# Patient Record
Sex: Male | Born: 1960 | Race: White | Hispanic: No | Marital: Single | Smoking: Former smoker
Health system: Southern US, Community
[De-identification: ages and names within clinical notes are randomized; demographics above are authoritative.]

## PROBLEM LIST (undated history)

## (undated) DIAGNOSIS — M86659 Other chronic osteomyelitis, unspecified thigh: Secondary | ICD-10-CM

## (undated) DIAGNOSIS — M726 Necrotizing fasciitis: Secondary | ICD-10-CM

## (undated) DIAGNOSIS — E1122 Type 2 diabetes mellitus with diabetic chronic kidney disease: Secondary | ICD-10-CM

## (undated) DIAGNOSIS — N186 End stage renal disease: Secondary | ICD-10-CM

## (undated) DIAGNOSIS — I10 Essential (primary) hypertension: Secondary | ICD-10-CM

## (undated) HISTORY — PX: TRANSMETATARSAL AMPUTATION: SHX6197

## (undated) HISTORY — PX: AV FISTULA PLACEMENT: SHX1204

---

## 2011-07-31 DIAGNOSIS — N186 End stage renal disease: Secondary | ICD-10-CM

## 2011-07-31 DIAGNOSIS — I10 Essential (primary) hypertension: Secondary | ICD-10-CM | POA: Diagnosis present

## 2015-10-14 DIAGNOSIS — D638 Anemia in other chronic diseases classified elsewhere: Secondary | ICD-10-CM | POA: Diagnosis present

## 2018-03-25 DIAGNOSIS — E1122 Type 2 diabetes mellitus with diabetic chronic kidney disease: Secondary | ICD-10-CM

## 2021-07-15 DIAGNOSIS — I739 Peripheral vascular disease, unspecified: Secondary | ICD-10-CM | POA: Diagnosis present

## 2021-10-08 DIAGNOSIS — M869 Osteomyelitis, unspecified: Secondary | ICD-10-CM | POA: Diagnosis present

## 2021-11-12 ENCOUNTER — Inpatient Hospital Stay (HOSPITAL_COMMUNITY): Payer: Medicaid Other

## 2021-11-12 ENCOUNTER — Encounter (HOSPITAL_COMMUNITY): Payer: Self-pay | Admitting: Internal Medicine

## 2021-11-12 ENCOUNTER — Inpatient Hospital Stay (HOSPITAL_COMMUNITY)
Admission: AD | Admit: 2021-11-12 | Discharge: 2021-12-14 | DRG: 871 | Disposition: E | Payer: Medicaid Other | Source: Other Acute Inpatient Hospital | Attending: Student | Admitting: Student

## 2021-11-12 DIAGNOSIS — Z66 Do not resuscitate: Secondary | ICD-10-CM | POA: Diagnosis not present

## 2021-11-12 DIAGNOSIS — R6521 Severe sepsis with septic shock: Secondary | ICD-10-CM | POA: Diagnosis not present

## 2021-11-12 DIAGNOSIS — I953 Hypotension of hemodialysis: Secondary | ICD-10-CM | POA: Diagnosis not present

## 2021-11-12 DIAGNOSIS — T361X5A Adverse effect of cephalosporins and other beta-lactam antibiotics, initial encounter: Secondary | ICD-10-CM | POA: Diagnosis present

## 2021-11-12 DIAGNOSIS — E1122 Type 2 diabetes mellitus with diabetic chronic kidney disease: Secondary | ICD-10-CM | POA: Diagnosis present

## 2021-11-12 DIAGNOSIS — Z87891 Personal history of nicotine dependence: Secondary | ICD-10-CM

## 2021-11-12 DIAGNOSIS — M869 Osteomyelitis, unspecified: Secondary | ICD-10-CM | POA: Diagnosis present

## 2021-11-12 DIAGNOSIS — G9341 Metabolic encephalopathy: Secondary | ICD-10-CM | POA: Diagnosis present

## 2021-11-12 DIAGNOSIS — I468 Cardiac arrest due to other underlying condition: Secondary | ICD-10-CM | POA: Diagnosis not present

## 2021-11-12 DIAGNOSIS — Z992 Dependence on renal dialysis: Secondary | ICD-10-CM

## 2021-11-12 DIAGNOSIS — A419 Sepsis, unspecified organism: Principal | ICD-10-CM | POA: Clinically undetermined

## 2021-11-12 DIAGNOSIS — Z833 Family history of diabetes mellitus: Secondary | ICD-10-CM

## 2021-11-12 DIAGNOSIS — N2581 Secondary hyperparathyroidism of renal origin: Secondary | ICD-10-CM | POA: Diagnosis present

## 2021-11-12 DIAGNOSIS — E1169 Type 2 diabetes mellitus with other specified complication: Secondary | ICD-10-CM | POA: Diagnosis present

## 2021-11-12 DIAGNOSIS — I120 Hypertensive chronic kidney disease with stage 5 chronic kidney disease or end stage renal disease: Secondary | ICD-10-CM | POA: Diagnosis present

## 2021-11-12 DIAGNOSIS — J9601 Acute respiratory failure with hypoxia: Secondary | ICD-10-CM | POA: Diagnosis not present

## 2021-11-12 DIAGNOSIS — J690 Pneumonitis due to inhalation of food and vomit: Secondary | ICD-10-CM | POA: Diagnosis not present

## 2021-11-12 DIAGNOSIS — I10 Essential (primary) hypertension: Secondary | ICD-10-CM | POA: Diagnosis present

## 2021-11-12 DIAGNOSIS — E1151 Type 2 diabetes mellitus with diabetic peripheral angiopathy without gangrene: Secondary | ICD-10-CM | POA: Diagnosis present

## 2021-11-12 DIAGNOSIS — I469 Cardiac arrest, cause unspecified: Secondary | ICD-10-CM | POA: Diagnosis not present

## 2021-11-12 DIAGNOSIS — J9600 Acute respiratory failure, unspecified whether with hypoxia or hypercapnia: Secondary | ICD-10-CM | POA: Diagnosis not present

## 2021-11-12 DIAGNOSIS — I674 Hypertensive encephalopathy: Secondary | ICD-10-CM | POA: Diagnosis not present

## 2021-11-12 DIAGNOSIS — Z79899 Other long term (current) drug therapy: Secondary | ICD-10-CM

## 2021-11-12 DIAGNOSIS — N186 End stage renal disease: Secondary | ICD-10-CM | POA: Diagnosis present

## 2021-11-12 DIAGNOSIS — G9340 Encephalopathy, unspecified: Secondary | ICD-10-CM | POA: Diagnosis present

## 2021-11-12 DIAGNOSIS — G928 Other toxic encephalopathy: Secondary | ICD-10-CM | POA: Diagnosis present

## 2021-11-12 DIAGNOSIS — J9602 Acute respiratory failure with hypercapnia: Secondary | ICD-10-CM | POA: Diagnosis not present

## 2021-11-12 DIAGNOSIS — Z823 Family history of stroke: Secondary | ICD-10-CM

## 2021-11-12 DIAGNOSIS — D638 Anemia in other chronic diseases classified elsewhere: Secondary | ICD-10-CM

## 2021-11-12 DIAGNOSIS — Z7982 Long term (current) use of aspirin: Secondary | ICD-10-CM

## 2021-11-12 DIAGNOSIS — M86671 Other chronic osteomyelitis, right ankle and foot: Secondary | ICD-10-CM | POA: Diagnosis present

## 2021-11-12 DIAGNOSIS — I739 Peripheral vascular disease, unspecified: Secondary | ICD-10-CM | POA: Diagnosis present

## 2021-11-12 DIAGNOSIS — Z89431 Acquired absence of right foot: Secondary | ICD-10-CM | POA: Diagnosis not present

## 2021-11-12 DIAGNOSIS — D631 Anemia in chronic kidney disease: Secondary | ICD-10-CM | POA: Diagnosis present

## 2021-11-12 DIAGNOSIS — Z515 Encounter for palliative care: Secondary | ICD-10-CM

## 2021-11-12 DIAGNOSIS — Z7902 Long term (current) use of antithrombotics/antiplatelets: Secondary | ICD-10-CM

## 2021-11-12 DIAGNOSIS — Z8249 Family history of ischemic heart disease and other diseases of the circulatory system: Secondary | ICD-10-CM

## 2021-11-12 DIAGNOSIS — Z794 Long term (current) use of insulin: Secondary | ICD-10-CM

## 2021-11-12 DIAGNOSIS — Z7189 Other specified counseling: Secondary | ICD-10-CM | POA: Diagnosis not present

## 2021-11-12 HISTORY — DX: Necrotizing fasciitis: M72.6

## 2021-11-12 HISTORY — DX: End stage renal disease: N18.6

## 2021-11-12 HISTORY — DX: Type 2 diabetes mellitus with diabetic chronic kidney disease: E11.22

## 2021-11-12 HISTORY — DX: Other chronic osteomyelitis, unspecified thigh: M86.659

## 2021-11-12 HISTORY — DX: Essential (primary) hypertension: I10

## 2021-11-12 LAB — GLUCOSE, CAPILLARY
Glucose-Capillary: 198 mg/dL — ABNORMAL HIGH (ref 70–99)
Glucose-Capillary: 160 mg/dL — ABNORMAL HIGH (ref 70–99)
Glucose-Capillary: 138 mg/dL — ABNORMAL HIGH (ref 70–99)
Glucose-Capillary: 133 mg/dL — ABNORMAL HIGH (ref 70–99)
Glucose-Capillary: 131 mg/dL — ABNORMAL HIGH (ref 70–99)

## 2021-11-12 LAB — COMPREHENSIVE METABOLIC PANEL
ALT: 13 U/L (ref 0–44)
AST: 16 U/L (ref 15–41)
Albumin: 3.3 g/dL — ABNORMAL LOW (ref 3.5–5.0)
Alkaline Phosphatase: 33 U/L — ABNORMAL LOW (ref 38–126)
Anion gap: 15 (ref 5–15)
BUN: 44 mg/dL — ABNORMAL HIGH (ref 8–23)
CO2: 24 mmol/L (ref 22–32)
Calcium: 9.8 mg/dL (ref 8.9–10.3)
Chloride: 102 mmol/L (ref 98–111)
Creatinine, Ser: 8.4 mg/dL — ABNORMAL HIGH (ref 0.61–1.24)
GFR, Estimated: 7 mL/min — ABNORMAL LOW (ref >60)
Glucose, Bld: 212 mg/dL — ABNORMAL HIGH (ref 70–99)
Potassium: 4.4 mmol/L (ref 3.5–5.1)
Sodium: 141 mmol/L (ref 135–145)
Total Bilirubin: 0.7 mg/dL (ref 0.3–1.2)
Total Protein: 7.7 g/dL (ref 6.5–8.1)

## 2021-11-12 LAB — CBC WITH DIFFERENTIAL/PLATELET
Abs Immature Granulocytes: 0.07 10*3/uL (ref 0.00–0.07)
Basophils Absolute: 0.1 10*3/uL (ref 0.0–0.1)
Basophils Relative: 1 %
Eosinophils Absolute: 0.2 10*3/uL (ref 0.0–0.5)
Eosinophils Relative: 2 %
HCT: 33.4 % — ABNORMAL LOW (ref 39.0–52.0)
Hemoglobin: 11.5 g/dL — ABNORMAL LOW (ref 13.0–17.0)
Immature Granulocytes: 1 %
Lymphocytes Relative: 15 %
Lymphs Abs: 1.9 10*3/uL (ref 0.7–4.0)
MCH: 31.5 pg (ref 26.0–34.0)
MCHC: 34.4 g/dL (ref 30.0–36.0)
MCV: 91.5 fL (ref 80.0–100.0)
Monocytes Absolute: 1.3 10*3/uL — ABNORMAL HIGH (ref 0.1–1.0)
Monocytes Relative: 10 %
Neutro Abs: 9.2 10*3/uL — ABNORMAL HIGH (ref 1.7–7.7)
Neutrophils Relative %: 71 %
Platelets: 205 10*3/uL (ref 150–400)
RBC: 3.65 MIL/uL — ABNORMAL LOW (ref 4.22–5.81)
RDW: 14.1 % (ref 11.5–15.5)
WBC: 12.8 10*3/uL — ABNORMAL HIGH (ref 4.0–10.5)
nRBC: 0 % (ref 0.0–0.2)

## 2021-11-12 LAB — HIV ANTIBODY (ROUTINE TESTING W REFLEX): HIV Screen 4th Generation wRfx: NONREACTIVE

## 2021-11-12 LAB — MAGNESIUM: Magnesium: 2.1 mg/dL (ref 1.7–2.4)

## 2021-11-12 LAB — HEMOGLOBIN A1C
Hgb A1c MFr Bld: 6.5 % — ABNORMAL HIGH (ref 4.8–5.6)
Mean Plasma Glucose: 139.85 mg/dL

## 2021-11-12 LAB — HEPATITIS B SURFACE ANTIGEN: Hepatitis B Surface Ag: NONREACTIVE

## 2021-11-12 LAB — HEPATITIS B SURFACE ANTIBODY,QUALITATIVE: Hep B S Ab: REACTIVE — AB

## 2021-11-12 IMAGING — MR MR HEAD W/O CM
9 of 10 series · 37 of 48 positions shown · non-contrast
Comparison: None Available.

CLINICAL DATA: Initial evaluation for mental status change, unknown
cause.

EXAM:
MRI HEAD WITHOUT CONTRAST
TECHNIQUE: Multiplanar, multiecho pulse sequences of the brain and surrounding
structures were obtained without intravenous contrast.

[Series 2: DWI · axial · 3.0mm · 0.94mm/px · z∈[-59,+86]mm · 8 of 100 slices shown (1 of 2)]
[im 1/100]
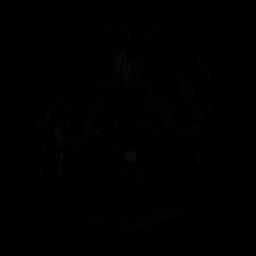
[im 12/100]
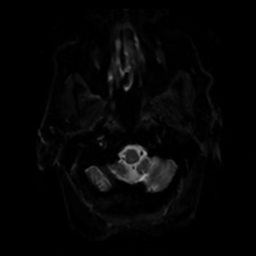
[im 34/100]
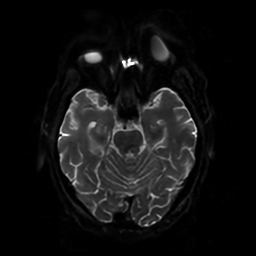
[im 45/100]
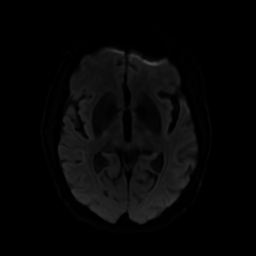
[im 56/100]
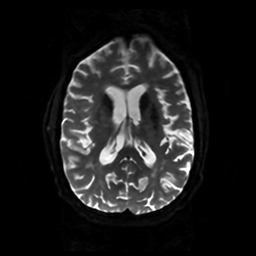
[im 67/100]
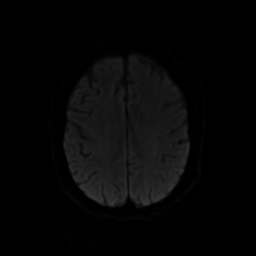
[im 89/100]
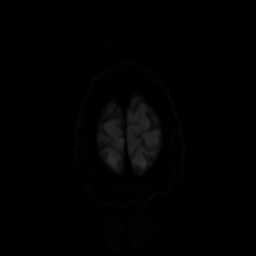
[im 100/100]
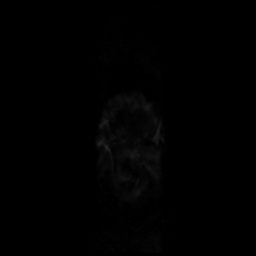

[Series 3: DWI · coronal · 4.0mm · 0.94mm/px · 8 of 74 slices shown (2 of 2)]
[im 1/74]
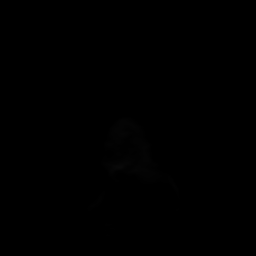
[im 11/74]
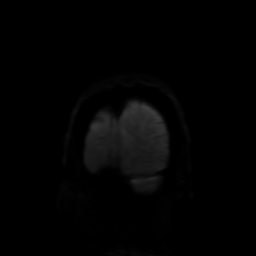
[im 21/74]
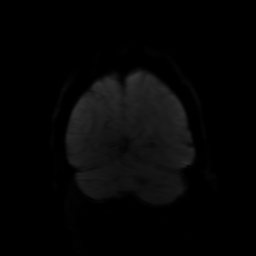
[im 32/74]
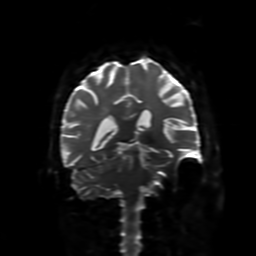
[im 42/74]
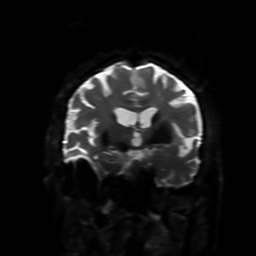
[im 53/74]
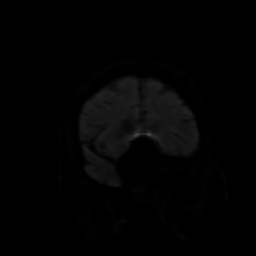
[im 63/74]
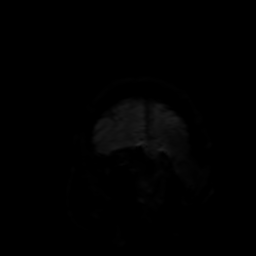
[im 74/74]
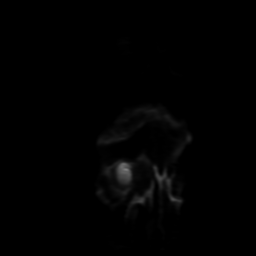

[Series 4: FLAIR · axial · 5.0mm · 0.47mm/px · z∈[-58,+84]mm · 2 of 25 slices shown (1 of 2)]
[im 1/25]
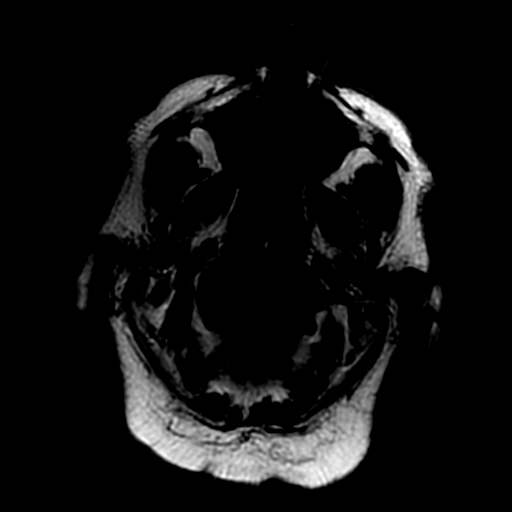
[im 25/25]
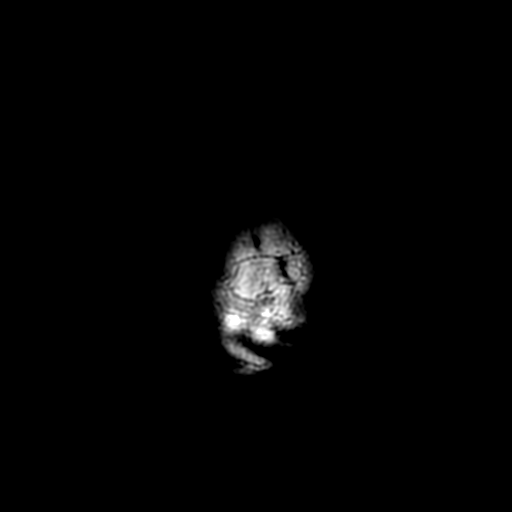

[Series 5: (person_name) · axial · 3.0mm · 0.47mm/px · z∈[-64,+27]mm · 5 of 100 slices shown]
[im 1/100]
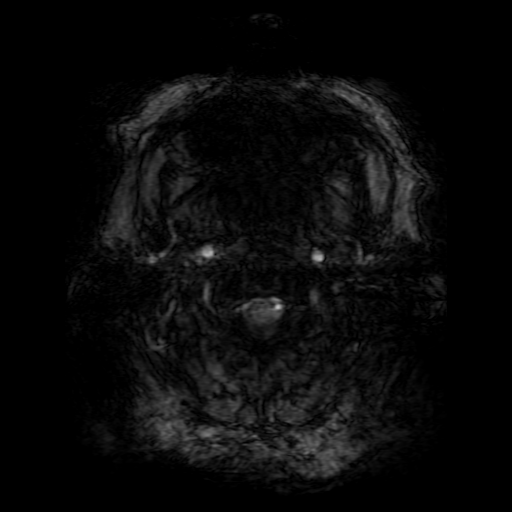
[im 13/100]
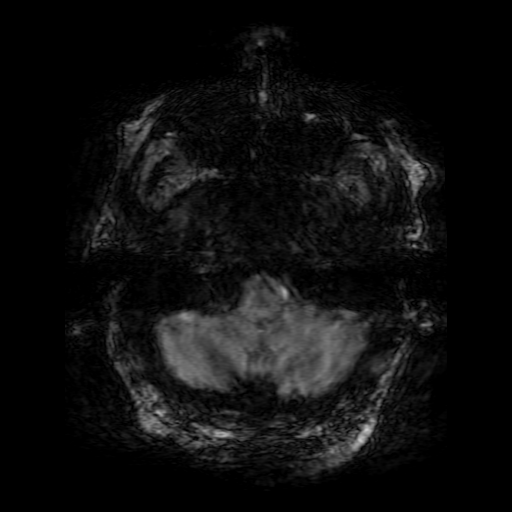
[im 25/100]
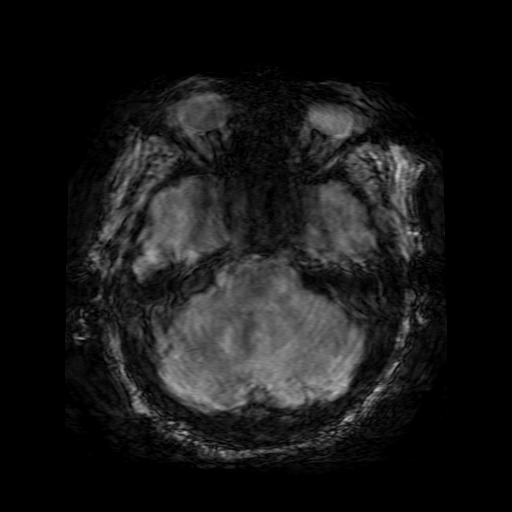
[im 38/100]
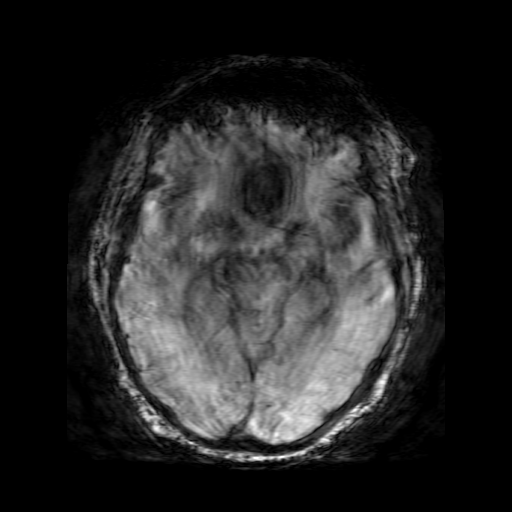
[im 62/100]
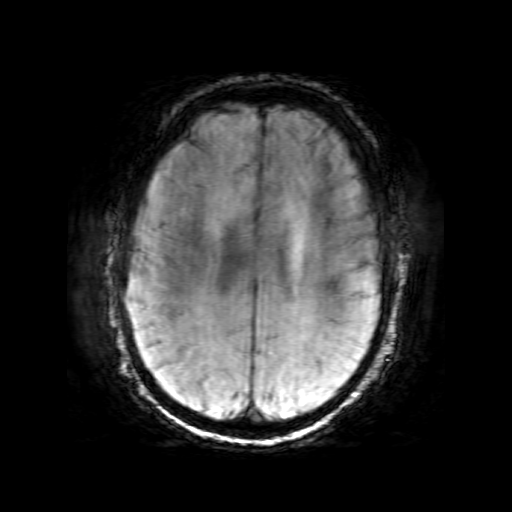

[Series 6: FLAIR · sagittal · 5.0mm · 0.47mm/px · 2 of 25 slices shown (2 of 2)]
[im 1/25]
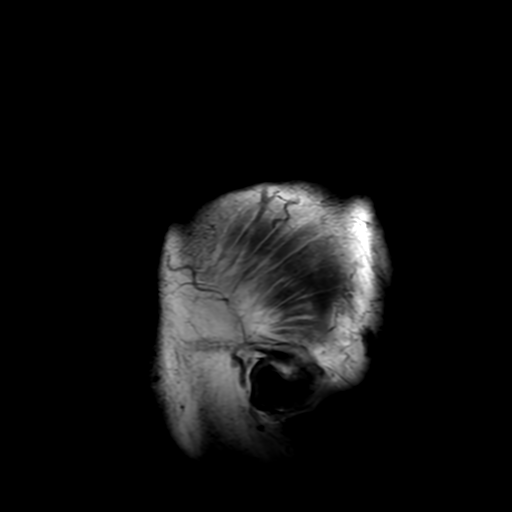
[im 25/25]
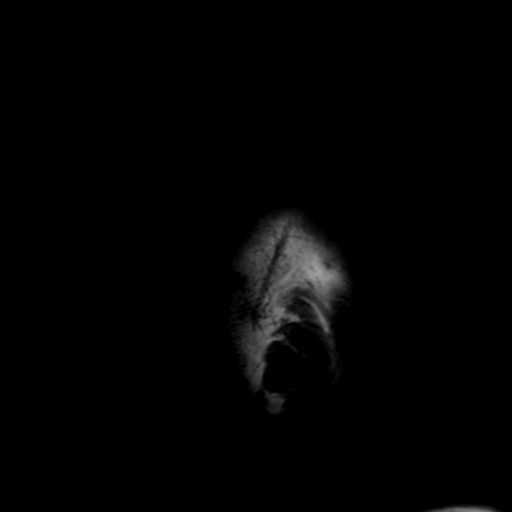

[Series 7: T2 · axial · 5.0mm · 0.47mm/px · z∈[-58,+84]mm · 2 of 25 slices shown (1 of 2)]
[im 1/25]
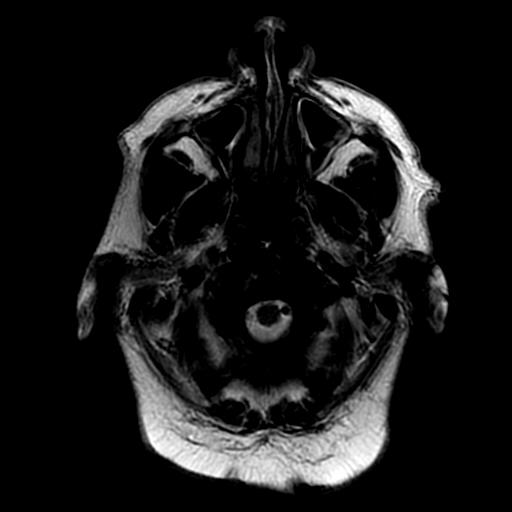
[im 25/25]
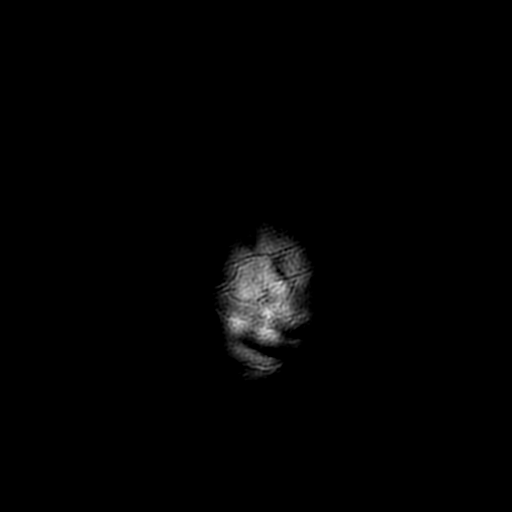

[Series 8: T2 · coronal · 5.0mm · 0.94mm/px · 3 of 31 slices shown (2 of 2)]
[im 1/31]
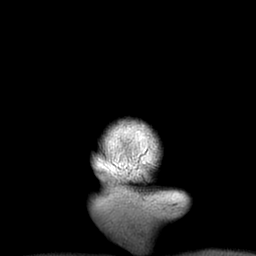
[im 16/31]
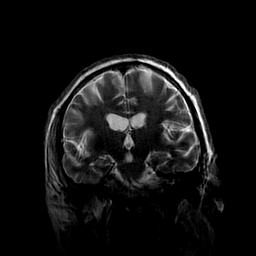
[im 31/31]
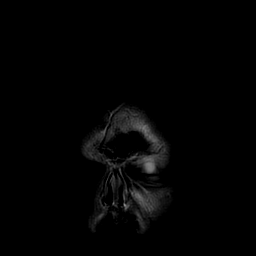

[Series 250: ADC · axial · 3.0mm · 0.94mm/px · z∈[-59,+86]mm · 4 of 45 slices shown (1 of 2)]
[im 1/45]
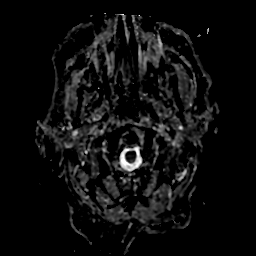
[im 15/45]
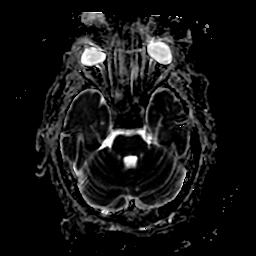
[im 30/45]
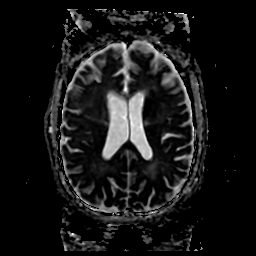
[im 45/45]
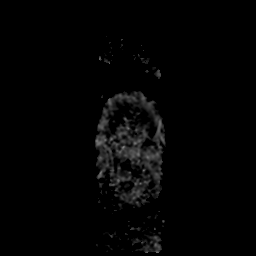

[Series 350: ADC · coronal · 4.0mm · 0.94mm/px · 3 of 37 slices shown (2 of 2)]
[im 1/37]
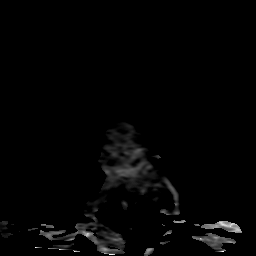
[im 19/37]
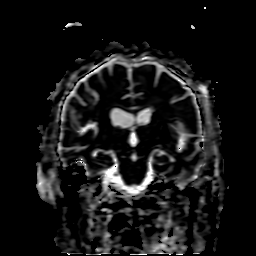
[im 37/37]
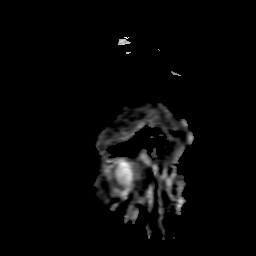

[37 of 48 positions shown; findings below may reference images not displayed]

FINDINGS: Brain: Examination moderately degraded by motion artifact.

Cerebral volume within normal limits. Mild chronic microvascular
ischemic disease noted involving the periventricular and deep white
matter both cerebral hemispheres. Small remote lacunar infarct noted
at the body of the corpus callosum.

No evidence for acute or subacute ischemia. Gray-white matter
differentiation maintained. No areas of chronic cortical infarction.
No visible acute or chronic intracranial blood products.

No mass lesion, mass effect or midline shift. No hydrocephalus or
extra-axial fluid collection. Pituitary gland suprasellar region
within normal limits. Midline structures intact and normally formed.

Vascular: Major intracranial vascular flow voids are grossly
maintained at the skull base.

Skull and upper cervical spine: Craniocervical junction within
normal limits. Bone marrow signal intensity normal. No scalp soft
tissue abnormality.

Sinuses/Orbits: Prior bilateral ocular lens replacement. Scattered
mucosal thickening noted throughout the paranasal sinuses. No
significant mastoid effusion.

Other: None.
IMPRESSION: 1. No acute intracranial abnormality.
2. Mild chronic microvascular ischemic disease for age.

## 2021-11-12 MED ORDER — INSULIN ASPART 100 UNIT/ML IJ SOLN: 0.0000 [IU] | Freq: Every day | INTRAMUSCULAR | Status: DC

## 2021-11-12 MED ORDER — HYDRALAZINE HCL 20 MG/ML IJ SOLN: 10.0000 mg | Freq: Four times a day (QID) | INTRAMUSCULAR | Status: DC | PRN

## 2021-11-12 MED ORDER — ACETAMINOPHEN 650 MG RE SUPP: 650.0000 mg | Freq: Four times a day (QID) | RECTAL | Status: DC | PRN

## 2021-11-12 MED ORDER — METRONIDAZOLE 500 MG PO TABS
500.0000 mg | ORAL_TABLET | Freq: Two times a day (BID) | ORAL | Status: AC
  Administered 2021-11-12: 500 mg via ORAL
  Filled 2021-11-12: qty 1

## 2021-11-12 MED ORDER — HALOPERIDOL LACTATE 5 MG/ML IJ SOLN
4.0000 mg | Freq: Four times a day (QID) | INTRAMUSCULAR | Status: DC | PRN
Start: 2021-11-12 — End: 2021-11-15

## 2021-11-12 MED ORDER — METRONIDAZOLE 500 MG/100ML IV SOLN
500.0000 mg | Freq: Two times a day (BID) | INTRAVENOUS | Status: DC
  Administered 2021-11-12: 500 mg via INTRAVENOUS
  Filled 2021-11-12: qty 100

## 2021-11-12 MED ORDER — HALOPERIDOL LACTATE 5 MG/ML IJ SOLN
2.0000 mg | Freq: Four times a day (QID) | INTRAMUSCULAR | Status: DC | PRN
Start: 2021-11-12 — End: 2021-11-12

## 2021-11-12 MED ORDER — DOXERCALCIFEROL 4 MCG/2ML IV SOLN
3.0000 ug | INTRAVENOUS | Status: AC
  Administered 2021-11-13: 3 ug via INTRAVENOUS
  Filled 2021-11-12: qty 2

## 2021-11-12 MED ORDER — LABETALOL HCL 5 MG/ML IV SOLN: 10.0000 mg | INTRAVENOUS | Status: DC | PRN

## 2021-11-12 MED ORDER — HEPARIN SODIUM (PORCINE) 5000 UNIT/ML IJ SOLN
5000.0000 [IU] | Freq: Three times a day (TID) | INTRAMUSCULAR | Status: DC
  Administered 2021-11-12 – 2021-11-15 (×8): 5000 [IU] via SUBCUTANEOUS
  Filled 2021-11-12 (×8): qty 1

## 2021-11-12 MED ORDER — DOXERCALCIFEROL 4 MCG/2ML IV SOLN: 3.0000 ug | INTRAVENOUS | Status: DC

## 2021-11-12 MED ORDER — VANCOMYCIN HCL IN DEXTROSE 1-5 GM/200ML-% IV SOLN
1000.0000 mg | INTRAVENOUS | Status: DC
Start: 2021-11-12 — End: 2021-11-12

## 2021-11-12 MED ORDER — HALOPERIDOL LACTATE 5 MG/ML IJ SOLN
4.0000 mg | Freq: Four times a day (QID) | INTRAMUSCULAR | Status: DC | PRN
Start: 2021-11-12 — End: 2021-11-15
  Administered 2021-11-13: 4 mg via INTRAVENOUS
  Filled 2021-11-12: qty 1

## 2021-11-12 MED ORDER — ONDANSETRON HCL 4 MG PO TABS: 4.0000 mg | ORAL_TABLET | Freq: Four times a day (QID) | ORAL | Status: DC | PRN

## 2021-11-12 MED ORDER — SODIUM CHLORIDE 0.9 % IV SOLN
2.0000 g | Freq: Once | INTRAVENOUS | Status: AC
  Administered 2021-11-12: 2 g via INTRAVENOUS
  Filled 2021-11-12 (×2): qty 12.5

## 2021-11-12 MED ORDER — CLONIDINE HCL 0.2 MG PO TABS: 0.2000 mg | ORAL_TABLET | ORAL | Status: DC | PRN

## 2021-11-12 MED ORDER — INSULIN ASPART 100 UNIT/ML IJ SOLN
0.0000 [IU] | Freq: Three times a day (TID) | INTRAMUSCULAR | Status: DC
  Administered 2021-11-12 – 2021-11-15 (×5): 1 [IU] via SUBCUTANEOUS
  Administered 2021-11-15: 2 [IU] via SUBCUTANEOUS

## 2021-11-12 MED ORDER — VANCOMYCIN HCL IN DEXTROSE 1-5 GM/200ML-% IV SOLN
1000.0000 mg | INTRAVENOUS | Status: AC
  Administered 2021-11-13: 1000 mg via INTRAVENOUS
  Filled 2021-11-12 (×2): qty 200

## 2021-11-12 MED ORDER — LORAZEPAM 2 MG/ML IJ SOLN: 1.0000 mg | Freq: Once | INTRAMUSCULAR | Status: DC | PRN

## 2021-11-12 MED ORDER — ONDANSETRON HCL 4 MG/2ML IJ SOLN
4.0000 mg | Freq: Four times a day (QID) | INTRAMUSCULAR | Status: DC | PRN
  Administered 2021-11-13 – 2021-11-14 (×2): 4 mg via INTRAVENOUS
  Filled 2021-11-12 (×2): qty 2

## 2021-11-12 MED ORDER — QUETIAPINE FUMARATE 25 MG PO TABS: 25.0000 mg | ORAL_TABLET | Freq: Every day | ORAL | Status: DC

## 2021-11-12 MED ORDER — AMLODIPINE BESYLATE 10 MG PO TABS
10.0000 mg | ORAL_TABLET | Freq: Every day | ORAL | Status: DC
  Administered 2021-11-12: 10 mg via ORAL
  Filled 2021-11-12: qty 1

## 2021-11-12 MED ORDER — HALOPERIDOL LACTATE 5 MG/ML IJ SOLN
2.0000 mg | Freq: Four times a day (QID) | INTRAMUSCULAR | Status: DC | PRN
  Administered 2021-11-12: 2 mg via INTRAMUSCULAR
  Filled 2021-11-12: qty 1

## 2021-11-12 MED ORDER — LORAZEPAM 2 MG/ML IJ SOLN
1.0000 mg | Freq: Once | INTRAMUSCULAR | Status: AC
  Administered 2021-11-12: 1 mg via INTRAVENOUS
  Filled 2021-11-12: qty 1

## 2021-11-12 MED ORDER — ACETAMINOPHEN 325 MG PO TABS: 650.0000 mg | ORAL_TABLET | Freq: Four times a day (QID) | ORAL | Status: DC | PRN

## 2021-11-12 MED ORDER — INSULIN GLARGINE-YFGN 100 UNIT/ML ~~LOC~~ SOLN
10.0000 [IU] | Freq: Every day | SUBCUTANEOUS | Status: DC
  Administered 2021-11-12 – 2021-11-14 (×4): 10 [IU] via SUBCUTANEOUS
  Filled 2021-11-12 (×7): qty 0.1

## 2021-11-12 MED ORDER — METOPROLOL TARTRATE 50 MG PO TABS
50.0000 mg | ORAL_TABLET | Freq: Two times a day (BID) | ORAL | Status: DC
  Administered 2021-11-12 – 2021-11-14 (×5): 50 mg via ORAL
  Filled 2021-11-12 (×5): qty 1

## 2021-11-12 MED ORDER — QUETIAPINE FUMARATE 50 MG PO TABS
50.0000 mg | ORAL_TABLET | Freq: Every day | ORAL | Status: DC
  Administered 2021-11-12: 50 mg via ORAL
  Filled 2021-11-12: qty 1

## 2021-11-12 MED ORDER — METOPROLOL TARTRATE 5 MG/5ML IV SOLN
5.0000 mg | INTRAVENOUS | Status: AC
  Administered 2021-11-12: 5 mg via INTRAVENOUS
  Filled 2021-11-12: qty 5

## 2021-11-12 MED ORDER — CHLORHEXIDINE GLUCONATE CLOTH 2 % EX PADS
6.0000 | MEDICATED_PAD | Freq: Every day | CUTANEOUS | Status: DC
Start: 2021-11-12 — End: 2021-11-16
  Administered 2021-11-12 – 2021-11-16 (×5): 6 via TOPICAL

## 2021-11-12 NOTE — Progress Notes (Signed)
Pt receives out-pt HD at [facility] on TTS on 2nd shift. Spoke to clinic staff who report that pt is not normally confused and is compliant with HD treatments. Pt rides transportation to get to/from HD appts. Clinic advised pt is currently hospitalized and that navigator will contact clinic once d/c date is known. Will assist as needed.   Melven Sartorius Renal Navigator [phone]

## 2021-11-12 NOTE — Progress Notes (Signed)
Informed by HD staff of change in patient's mental status, requiring haldol and restraints. Due for MRI. Will hold off on HD today and aim to do HD tomorrow. Had seen and examined him earlier and his volume status was acceptable, K WNL today. No emergent indications for HD today.  Gean Quint, MD [facility]

## 2021-11-12 NOTE — Assessment & Plan Note (Signed)
Chronic. 

## 2021-11-12 NOTE — Evaluation (Signed)
Physical Therapy Evaluation Patient Details Name: Henry Phillips MRN: [id number] DOB: Aug 26, 1960 Today's Date: 10/29/2021  History of Present Illness  Pt is a 61 y.o. male admitted 10/24/2021 with confusion, uncontrolled HTN. Head CT negative for acute abnormality. Workup for hypertensive urgency, acute metabolic encephalopathy with concern for PRES. EEG suggestive of moderate diffuse encephalopathy; no seizures seen. Brain MRI pending. 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   Clinical Impression  Pt presents with an overall decrease in functional mobility secondary to above. Per chart, pt resides with sister, has transportation for HD appts; today, pt disoriented and poor historian, reports he lives alone and indep without. Today, pt mod indep with bed mobility; deferred OOB mobility due to safety concerns reported by nursing. Pt demonstrates good strength; expect pt will progress well with mobility once cognition/AMS improves. Will follow acutely to address established goals.    Recommendations for follow up therapy are one component of a multi-disciplinary discharge planning process, led by the attending physician.  Recommendations may be updated based on patient status, additional functional criteria and insurance authorization.  Follow Up Recommendations No PT follow up    Assistance Recommended at Discharge Frequent or constant Supervision/Assistance  Patient can return home with the following  Assistance with cooking/housework;Direct supervision/assist for medications management;Direct supervision/assist for financial management;Assist for transportation    Equipment Recommendations None recommended by PT  Recommendations for Other Services       Functional Status Assessment Patient has had a recent decline in their functional status and demonstrates the ability to make significant improvements in function in a reasonable and predictable amount of  time.     Precautions / Restrictions Precautions Precautions: Fall;Other (comment) Precaution Comments: wrist restraints Restrictions Weight Bearing Restrictions: No      Mobility  Bed Mobility Overal bed mobility: Modified Independent             General bed mobility comments: supine<>sit mod indep with HOB minimally elevated. pt indep to bridge hips up in bed multiple times, even in wrist restraints, to reposition bedding    Transfers                   General transfer comment: deferred standing activity for safety due to pt's AMS    Ambulation/Gait                  Stairs            Wheelchair Mobility    Modified Rankin (Stroke Patients Only)       Balance Overall balance assessment: Needs assistance   Sitting balance-Leahy Scale: Good                                       Pertinent Vitals/Pain Pain Assessment Pain Assessment: No/denies pain    Home Living Family/patient expects to be discharged to:: Private residence Living Arrangements: Other relatives Available Help at Discharge: Family Type of Home: House Home Access: Ramped entrance       Home Layout: Two level Home Equipment: Wheelchair - manual Additional Comments: suspect not fully reliable information - pt reports he lives alone; per CM note, pt has lived with sister's [name]) family for past 14 yrs. per chart, HD clinic reports pt typically A&Ox4    Prior Function Prior Level of Function : Patient poor historian/Family not available;Independent/Modified Independent  Mobility Comments: pt reports indep without DME; unsure reliable info. per CM note, sister reports having w/c and ramp; pt has [place] [employer]) who manages R foot amputation site; family drives pt, transportation provided for HD appts       Hand Dominance        Extremity/Trunk Assessment   Upper Extremity Assessment Upper Extremity Assessment: Overall WFL for tasks  assessed;Difficult to assess due to impaired cognition    Lower Extremity Assessment Lower Extremity Assessment: Overall WFL for tasks assessed;RLE deficits/detail;Difficult to assess due to impaired cognition RLE Deficits / Details: s/p R foot TMA       Communication   Communication: Expressive difficulties  Cognition Arousal/Alertness: Awake/alert Behavior During Therapy: WFL for tasks assessed/performed, Restless Overall Cognitive Status: Impaired/Different from baseline Area of Impairment: Orientation, Attention, Following commands, Safety/judgement, Awareness                 Orientation Level: Disoriented to, Place, Time, Situation Current Attention Level: Focused, Sustained   Following Commands: Follows one step commands consistently, Follows one step commands with increased time Safety/Judgement: Decreased awareness of safety, Decreased awareness of deficits     General Comments: pt pleasant and cooperative; restless with UE movements (pulling at lines/gown) when restraints removed, but able to be redirected. pt only oriented to himself; reoriented but unable to recall details later in session. pt poor historian. answering yes/no questions inappropriately. pt answering many questions with "mine... my own..."        General Comments      Exercises     Assessment/Plan    PT Assessment Patient needs continued PT services  PT Problem List Decreased balance;Decreased mobility;Decreased cognition;Decreased safety awareness       PT Treatment Interventions DME instruction;Gait training;Stair training;Functional mobility training;Therapeutic activities;Therapeutic exercise;Balance training;Patient/family education    PT Goals (Current goals can be found in the Care Plan section)  Acute Rehab PT Goals Patient Stated Goal: return home PT Goal Formulation: With patient Time For Goal Achievement: 11/26/21 Potential to Achieve Goals: Good    Frequency Min 3X/week      Co-evaluation               AM-PAC PT "6 Clicks" Mobility  Outcome Measure Help needed turning from your back to your side while in a flat bed without using bedrails?: None Help needed moving from lying on your back to sitting on the side of a flat bed without using bedrails?: None Help needed moving to and from a bed to a chair (including a wheelchair)?: A Little Help needed standing up from a chair using your arms (e.g., wheelchair or bedside chair)?: A Little Help needed to walk in hospital room?: A Little Help needed climbing 3-5 steps with a railing? : A Little 6 Click Score: 20    End of Session   Activity Tolerance: Patient tolerated treatment well Patient left: in bed;with call bell/phone within reach;with bed alarm set;with restraints reapplied Nurse Communication: Mobility status PT Visit Diagnosis: Other abnormalities of gait and mobility (R26.89)    Time: 0272-5366 PT Time Calculation (min) (ACUTE ONLY): 19 min   Charges:   PT Evaluation $PT Eval Moderate Complexity: [place], PT, DPT Acute Rehabilitation Services  Pager [phone] Office [place] 10/28/2021, 5:41 PM

## 2021-11-12 NOTE — H&P (Signed)
History and Physical    Bayne Fosnaugh AYT:[id number] DOB: 1961-02-25 DOA: 11/01/2021  DOS: the patient was seen and examined on 11/03/2021  PCP: Pcp, No   Patient coming from: Home  I have personally briefly reviewed patient's old medical records in [place]  CC: altered mental status HPI: 61 year old white male history of end-stage renal disease on hemodialysis via left upper extremity AV fistula, chronic osteomyelitis, recent right transmetatarsal amputation of the foot, hypertension, type 2 diabetes on insulin, anemia chronic kidney disease, history of peripheral vascular disease, transferred from [facility] today.  He was seen in the ER at [facility] today for altered mental status.  No family is available.  Patient unable to give history.  HPI obtained from the chart.  Apparently patient's been acting strange for a couple days.  He goes to dialysis on Tuesday Thursday Saturday.  ALL of his medical care has been with [facility] and with other entities outside of [place].  We have no records for him at [facility].  Also no imaging was sent with the patient today.  It appears the patient had a transmetatarsal amputation back in April 2023 at [facility]  He was placed on antibiotics with vancomycin and cefepime during dialysis days.  End of therapy was supposed to be on Nov 13, 2021.  He showed up to the ER at [facility] today hypertensive.  Admitting vital signs in the ER were temperature 98.2 heart rate 112 blood pressure 204/78 satting 98% on room air.  He was unable to answer any questions.  Lab work at [facility] showed a white count of 9.5 hemoglobin of 11.3, platelets of 182  Sodium 142, potassium 4.5 chloride 98 bicarb of 29, BUN of 43, creatinine 7.4, glucose of 201  Albumin 4.4, AST of 23 ALT of 15 alk phos of 53 total bili 0.7  Lactic acid was normal at 1.4  CT head showed no acute intracranial abnormality but there was apparent motion  artifact.  Again no imaging were sent on disc.  Due to the patient's profound malignant hypertension and hypertensive encephalopathy, the patient was started on a Cardene drip.  Unclear if the ER attempted to try to transfer him to another facility other than [place].  [place] was contacted.  Patient excepted to them progressive bed.  Patient was started on Cardene at 5 mg/h and rapidly uptitrated to 14 mg/h.  His blood pressure was improved.  He was transferred out of [facility] ER on a Cardene drip.  He arrived to [facility] on the progressive floor on a Cardene drip.    ED Course: Noted be hypertensive in the [facility] ED.  Started on nicardipine drip.  Review of Systems:  Review of Systems  Unable to perform ROS: Mental acuity , encephalopathy  Past Medical History:  Diagnosis Date   Chronic osteomyelitis of pelvic region ([place])    ESRD (end stage renal disease) on dialysis ([place])    Essential hypertension    Necrotizing fasciitis ([place])    Type 2 DM with hypertension and ESRD on dialysis [facility])     Past Surgical History:  Procedure Laterality Date   AV FISTULA PLACEMENT     left radiocephalic   TRANSMETATARSAL AMPUTATION Right      reports that he has quit smoking. His smoking use included cigarettes. He has a 10.50 pack-year smoking history. He has never used smokeless tobacco. He reports that he does not currently  use alcohol. He reports that he does not use drugs.  Not on File  Family History  Problem Relation Age of Onset   Hypertension Father    Heart disease Father    Diabetes Sister    Diabetes Brother    Stroke Paternal Grandmother    Home Meds: amlodipine (amlodipine)  10 mg, oral, 1 tablet every night  Aspirin Childrens (aspirin)  81 mg, oral, 1 tablet once a day  atorvastatin (atorvastatin)  40 mg, oral, 1 tablet once a day  Lantus U-100 Insulin (insulin glargine)  100 unit/mL, subQ, 10 unit at bedtime  metoprolol tartrate (metoprolol tartrate)   50 mg, oral, 1 tablet twice a day  Norco (hydrocodone-acetaminophen)  5-325 mg, , 1 tablet every six hours  [for pain.]  Plavix (clopidogrel)  75 mg, oral, 1 tablet once a day  Renagel (sevelamer hcl)  800 mg, oral, 3 tablet three times a day  Sensipar (cinacalcet)  60 mg, oral, 1 tablet as directed  [take on non-dialysis days only]   Physical Exam: There were no vitals filed for this visit.  Physical Exam Vitals and nursing note reviewed.  Constitutional:      Comments: Confused and disoriented.  Patient unaware that he is at [place]:     Head: Normocephalic and atraumatic.     Nose: Nose normal. No rhinorrhea.  Cardiovascular:     Rate and Rhythm: Regular rhythm. Tachycardia present.  Pulmonary:     Effort: Pulmonary effort is normal.     Breath sounds: Normal breath sounds. No wheezing.  Abdominal:     General: Bowel sounds are normal. There is no distension.     Tenderness: There is no abdominal tenderness. There is no guarding.     Comments: Appears to have had extensive abdominal surgery.  His upper torso is barrel like in his abdominal area is much smaller and narrower than his chest area.  Skin:    Capillary Refill: Capillary refill takes less than 2 seconds.     Comments: Right metatarsal amputation.  Suture lines appear intact.  There is no apparent drainage.  Neurological:     Mental Status: He is alert. He is disoriented.     Labs on Admission: I have personally reviewed following labs and imaging studies  Mon Nov 11, 2021  1346 WBC: 9.5  1346 HGB(!): 11.3  1346 Platelet: 182  1346 Sodium: 142  1346 Potassium: 4.5  1346 CO2: 29.0  1346 Bun(!): 43  1346 Creatinine(!): 7.40  1346 Glucose(!): 201  1457 Troponin I: <0.034  1457 Magnesium: 2.0  1457 Phosphorus: 4.1  1457 Sed Rate(!): 85  Radiological Exams on Admission:  Computed tomography, head or brain without contrast material.  DATE: 11/11/2021 1:57 PM  ACCESSION: [id number] [facility]   DICTATED: 11/11/2021 2:35 PM  INTERPRETATION LOCATION: [place]   CLINICAL INDICATION: 61 years old Male with AMS ; Delirium     COMPARISON: None   TECHNIQUE: Axial CT images of the head  from skull base to vertex without contrast.   FINDINGS:   Motion artifact limits exam.  There is no midline shift. No mass lesion. There is no evidence of acute infarct. No acute intracranial hemorrhage. No fractures are evident. Bilateral maxillary sinus mucosal thickening.  EKG: My personal interpretation of EKG shows: no EKG   Assessment/Plan Principal Problem:   Malignant hypertension Active Problems:   Hypertensive encephalopathy   Anemia of chronic disease   ESRD on hemodialysis ([place])  Essential hypertension   Osteomyelitis, unspecified ([place])   Peripheral vascular disease ([place])   Type 2 DM with hypertension and ESRD on dialysis ([place])    Assessment and Plan: * Malignant hypertension Admit to progressive bed. Will wean nicardipine gtts to off as this medication is not approved for medical floor level use. Give 5 mg IV lopressor now in attempt to lower his 125 bpm HR. Restart his home lopressor 50 mg bid. He may need additional agents to keep his BP under control. Prn clonidine 0.2 mg q4h prn SBP>170 or DBP>100.  Restart his home norvasc at 10 mg daily.  If his BP cannot be controlled with home meds and prn clonidine, will need to consider restarting nicardipine and transfer pt to MICU.  Malignant hypertension is a Acute illness/condition that poses a threat to life or bodily function.   Hypertensive encephalopathy Acute. Unclear what the cause of his malignant hypertension and subsequent hypertensive encephalopathy.  No family is available.  Unclear if he has been compliant with his medications.  CT head performed at [facility] reports no acute intracranial abnormality.  Images were sent and I cannot personally review or interpret any imaging.  If he continues to be confused,  will need to consider MRI brain to rule out stroke or watershed infarct due to his hypertension.  Type 2 DM with hypertension and ESRD on dialysis [facility]) Patient on Lantus insulin according to his last nephrology note.  We will continue with Lantus at 10 units at bedtime and add renal dose sliding scale insulin.  Diabetic/renal diet with fluid restrictions.  Peripheral vascular disease (HCC) Stable.  Patient on aspirin 81 mg and Plavix 75 mg  Osteomyelitis, unspecified (HCC) Patient was on IV cefepime vancomycin and Flagyl during dialysis days.  ID consult notes from care everywhere states that his antibiotic therapy was to end on 11/13/2021.  I think this was after his transmetatarsal amputation of his right foot from April 2023.  Essential hypertension Restart his home blood pressure medication including Lopressor 50 mg, Norvasc 10 mg  ESRD on hemodialysis (HCC) Chronic.  Reportedly he dialyzes on Tuesday Thursday Saturday.  Will need dialysis tomorrow.  Will need nephrology consult in the morning.  He has a left forearm AV fistula.  Anemia of chronic disease Chronic.    DVT prophylaxis: SQ Heparin Code Status: Full Code by default Family Communication: no family at bedside  Disposition Plan: return home  Consults called: none  Admission status: Observation,  progressive   Kristopher Oppenheim, DO [facility] 11/04/2021, 1:10 AM

## 2021-11-12 NOTE — Progress Notes (Signed)
PROGRESS NOTE        PATIENT DETAILS Name: Henry Phillips Age: 61 y.o. Sex: male Date of Birth: 04-05-1961 Admit Date: 11/08/2021 Admitting Physician Kristopher Oppenheim, DO PCP:Pcp, No  Brief Summary: Patient is a 61 y.o.  male with recent history of right foot osteomyelitis-s/p TMA on 4/20 at [facility] IV Vanco/cefepime/Flagyl until 5/31-ESRD on HD TTS-admitted for evaluation of confusion and malignant hypertension.  Significant events: 5/30>> transfer from [facility] for evaluation of confusion/uncontrolled hypertension.  Significant studies: 5/29>> CT head (done at [facility]): No acute abnormalities. 5/29>> x-ray right/left foot (done at [facility]): No obvious osteomyelitis  Significant microbiology data: None  Procedures: None  Consults: Nephrology  Subjective: Appears comfortable-able to tell me his name-but is otherwise confused.  Follows commands but just does not answer questions appropriately.  Objective: Vitals: Blood pressure (!) 170/80, pulse 84, temperature 98.5 F (36.9 C), temperature source Oral, resp. rate 17, weight 80.2 kg, SpO2 99 %.   Exam: Gen Exam: Pleasantly confused HEENT:atraumatic, normocephalic Chest: B/L clear to auscultation anteriorly CVS:S1S2 regular Abdomen:soft non tender, non distended Extremities:no edema.  Right TMA site without any decent/discharge or erythema. Neurology: Non focal Skin: no rash  Pertinent Labs/Radiology:    Latest Ref Rng & Units 11/10/2021   12:38 AM  CBC  WBC 4.0 - 10.5 K/uL 12.8    Hemoglobin 13.0 - 17.0 g/dL 11.5    Hematocrit 39.0 - 52.0 % 33.4    Platelets 150 - 400 K/uL 205      Lab Results  Component Value Date   NA 141 11/02/2021   K 4.4 10/27/2021   CL 102 10/19/2021   CO2 24 10/30/2021      Assessment/Plan: Acute metabolic encephalopathy: Concern for PRES-still confused-obtaining MRI brain and EEG.  Per Sister Kathy-patient is oriented x4 at  baseline.  Hypertensive emergency: On nicardipine infusion yesterday-blood pressure still elevated-but overall much better (per H&P-more than 326 systolic on initial presentation at [facility] ED).  Continue amlodipine/metoprolol-hopefully will improve post HD.  Per sister Kathy-patient's blood pressure fluctuates quite a bit and at times he will be hypotensive.  Osteomyelitis of right foot-s/p right TMA by podiatry at [facility] on 10/03/2021: Amputation site looks benign-Per review of discharge summary-last day for Vanco/cefepime/Flagyl 5/31.  ESRD: Nephrology consulted for HD.  Normocytic anemia: Mild-follow hemoglobin-defer Aranesp/IV iron to nephrology  PAD: Continue to hold antiplatelets-resume once MRI brain has been completed.  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  Recent Labs    11/06/2021 0243 10/20/2021 0732  GLUCAP 198* 160*     Code status:   Code Status: Full Code   DVT Prophylaxis: heparin injection 5,000 Units Start: 10/24/2021 0600 SCDs Start: 11/05/2021 0027   Family Communication: sister [name] [phone] updated over the phone on 5/30   Disposition Plan: Status is: Observation The patient will require care spanning > 2 midnights and should be moved to inpatient because: Persistent encephalopathy-getting further work-up-MRI/EEG-not stable for discharge.   Planned Discharge Destination:Home health   Diet: Diet Order             Diet renal/carb modified with fluid restriction Diet-HS Snack? Nothing; Fluid restriction: 1200 mL Fluid; Room service appropriate? Yes; Fluid consistency: Thin  Diet effective now  Antimicrobial agents: Anti-infectives (From admission, onward)    Start     Dose/Rate Route Frequency Ordered Stop   10/14/2021 1600  ceFEPIme (MAXIPIME) 2 g in sodium chloride 0.9 % 100 mL IVPB        2 g 200 mL/hr over 30 Minutes Intravenous  Once 10/20/2021 0713      11/09/2021 1200  vancomycin (VANCOCIN) IVPB 1000 mg/200 mL premix        1,000 mg 200 mL/hr over 60 Minutes Intravenous Every T-Th-Sa (Hemodialysis) 10/27/2021 0713 11/14/21 1159   10/24/2021 0800  metroNIDAZOLE (FLAGYL) IVPB 500 mg        500 mg 100 mL/hr over 60 Minutes Intravenous Every 12 hours 11/11/2021 0705          MEDICATIONS: Scheduled Meds:  amLODipine  10 mg Oral Daily   Chlorhexidine Gluconate Cloth  6 each Topical Q0600   doxercalciferol  3 mcg Intravenous Q T,Th,Sa-HD   heparin  5,000 Units Subcutaneous Q8H   insulin aspart  0-5 Units Subcutaneous QHS   insulin aspart  0-6 Units Subcutaneous TID WC   insulin glargine-yfgn  10 Units Subcutaneous QHS   metoprolol tartrate  50 mg Oral BID   Continuous Infusions:  ceFEPime (MAXIPIME) IV     metronidazole 500 mg (10/27/2021 0909)   vancomycin     PRN Meds:.acetaminophen **OR** acetaminophen, cloNIDine, ondansetron **OR** ondansetron (ZOFRAN) IV   I have personally reviewed following labs and imaging studies  LABORATORY DATA: CBC: Recent Labs  Lab 10/14/2021 0038  WBC 12.8*  NEUTROABS 9.2*  HGB 11.5*  HCT 33.4*  MCV 91.5  PLT 858    Basic Metabolic Panel: Recent Labs  Lab 11/05/2021 0038  NA 141  K 4.4  CL 102  CO2 24  GLUCOSE 212*  BUN 44*  CREATININE 8.40*  CALCIUM 9.8  MG 2.1    GFR: CrCl cannot be calculated (Unknown ideal weight.).  Liver Function Tests: Recent Labs  Lab 10/20/2021 0038  AST 16  ALT 13  ALKPHOS 33*  BILITOT 0.7  PROT 7.7  ALBUMIN 3.3*   No results for input(s): LIPASE, AMYLASE in the last 168 hours. No results for input(s): AMMONIA in the last 168 hours.  Coagulation Profile: No results for input(s): INR, PROTIME in the last 168 hours.  Cardiac Enzymes: No results for input(s): CKTOTAL, CKMB, CKMBINDEX, TROPONINI in the last 168 hours.  BNP (last 3 results) No results for input(s): PROBNP in the last 8760 hours.  Lipid Profile: No results for input(s): CHOL, HDL,  LDLCALC, TRIG, CHOLHDL, LDLDIRECT in the last 72 hours.  Thyroid Function Tests: No results for input(s): TSH, T4TOTAL, FREET4, T3FREE, THYROIDAB in the last 72 hours.  Anemia Panel: No results for input(s): VITAMINB12, FOLATE, FERRITIN, TIBC, IRON, RETICCTPCT in the last 72 hours.  Urine analysis: No results found for: COLORURINE, APPEARANCEUR, LABSPEC, PHURINE, GLUCOSEU, HGBUR, BILIRUBINUR, KETONESUR, PROTEINUR, UROBILINOGEN, NITRITE, LEUKOCYTESUR  Sepsis Labs: Lactic Acid, Venous No results found for: LATICACIDVEN  MICROBIOLOGY: No results found for this or any previous visit (from the past 240 hour(s)).  RADIOLOGY STUDIES/RESULTS: No results found.   LOS: 1 day   Oren Binet, MD  [facility]    To contact the attending provider between 7A-7P or the covering provider during after hours 7P-7A, please log into the web site [email or web address] and access using universal [place] password for that web site. If you do not have the password, please call the hospital operator.  11/11/2021, 10:21 AM

## 2021-11-12 NOTE — Assessment & Plan Note (Addendum)
Stable.  Patient on aspirin 81 mg and Plavix 75 mg

## 2021-11-12 NOTE — Assessment & Plan Note (Signed)
Acute. Unclear what the cause of his malignant hypertension and subsequent hypertensive encephalopathy.  No family is available.  Unclear if he has been compliant with his medications.  CT head performed at [facility] reports no acute intracranial abnormality.  Images were sent and I cannot personally review or interpret any imaging.  If he continues to be confused, will need to consider MRI brain to rule out stroke or watershed infarct due to his hypertension.

## 2021-11-12 NOTE — Assessment & Plan Note (Signed)
Chronic.  Reportedly he dialyzes on Tuesday Thursday Saturday.  Will need dialysis tomorrow.  Will need nephrology consult in the morning.  He has a left forearm AV fistula.

## 2021-11-12 NOTE — Progress Notes (Signed)
Weaned Nicardipine drip per MD order.  Patient no longer receiving

## 2021-11-12 NOTE — Assessment & Plan Note (Signed)
Patient was on IV cefepime vancomycin and Flagyl during dialysis days.  ID consult notes from care everywhere states that his antibiotic therapy was to end on 11/13/2021.  I think this was after his transmetatarsal amputation of his right foot from April 2023.

## 2021-11-12 NOTE — Progress Notes (Addendum)
Patient arrived to room 5w30 from outside facility.  Assessment complete, VS obtained, and Admission database difficult to obtain, patient is confused.  Nicardipine drip infusing 5 mg/hr.

## 2021-11-12 NOTE — Assessment & Plan Note (Addendum)
Admit to progressive bed. Will wean nicardipine gtts to off as this medication is not approved for medical floor level use. Give 5 mg IV lopressor now in attempt to lower his 125 bpm HR. Restart his home lopressor 50 mg bid. He may need additional agents to keep his BP under control. Prn clonidine 0.2 mg q4h prn SBP>170 or DBP>100.  Restart his home norvasc at 10 mg daily.  If his BP cannot be controlled with home meds and prn clonidine, will need to consider restarting nicardipine and transfer pt to MICU.  Malignant hypertension is a Acute illness/condition that poses a threat to life or bodily function.

## 2021-11-12 NOTE — Procedures (Signed)
Patient Name: Henry Phillips  MRN: [id number]  Epilepsy Attending: Lora Havens  Referring Physician/Provider: Jonetta Osgood, MD Date: 10/29/2021 Duration: 21.29 mins  Patient history: 61 year old male with altered mental status.  EEG to evaluate for seizure.  Level of alertness: Awake  AEDs during EEG study: None  Technical aspects: This EEG study was done with scalp electrodes positioned according to the 10-20 International system of electrode placement. Electrical activity was acquired at a sampling rate of 500Hz  and reviewed with a high frequency filter of 70Hz  and a low frequency filter of 1Hz . EEG data were recorded continuously and digitally stored.   Description: No clear posterior dominant rhythm was seen.  EEG showed continuous generalized predominantly 6 to 9 Hz theta-alpha activity admixed with intermittent generalized 2 to 3 Hz delta slowing which at times appears sharply contoured.  Hyperventilation and photic stimulation were not performed.     ABNORMALITY - Continuous slow, generalized  IMPRESSION: This study is suggestive of moderate diffuse encephalopathy, nonspecific etiology. No seizures or epileptiform discharges were seen throughout the recording.  Alana Dayton Barbra Sarks

## 2021-11-12 NOTE — Consult Note (Signed)
ESRD Consult Note  Assessment/Recommendations:   ESRD:  -Outpatient orders: [facility], TTS, 3 hours 45 minutes, F1 80, BFR 450/autoflow 1.5, EDW 85kg, 2K, 3Ca.  Meds: Hectorol 3 mcg q. treatment, heparin 2000 units bolus, 1000 units mid run -Will arrange for dialysis today, will maintain TTS schedule  Volume/ hypertension Hypertensive encephalopathy -EDW 85 kg . Attempt to achieve EDW as tolerated -Resume home meds, will see how he does with his blood pressure after dialysis.  If needed, can keep him on p.o. clonidine -MRI brain if he continues to be confused  Osteomyelitis, chronic -Was supposed to be on vancomycin, cefepime, Flagyl which was supposed to end on 5/31.  Will defer to primary service to see if this needs to be continued  Anemia of Chronic Kidney Disease:  -Hemoglobin 11.5, not receiving any iron or ESA's as an outpatient.  Monitor for now.    Secondary Hyperparathyroidism/Hyperphosphatemia: Resume home sevelamer, Sensipar.  We will resume his Hectorol  DM2 -Management per primary service  Vascular access: Left upper extremity aVF  Additional recommendations: - Dose all meds for creatinine clearance < 10 ml/min  - Unless absolutely necessary, no MRIs with gadolinium.  - Implement save arm precautions.  Prefer needle sticks in the dorsum of the hands or wrists.  No blood pressure measurements in arm. - If blood transfusion is requested during hemodialysis sessions, please alert [place] prior to the session.  - If a hemodialysis catheter line culture is requested, please alert [place] as only hemodialysis nurses are able to collect those specimens.   Recommendations were discussed with the primary team.   History of Present Illness: Henry Phillips is a/an 61 y.o. male with a past medical history of ESRD on HD, chronic osteomyelitis, recent right transmetatarsal amputation, hypertension, DM 2, chronic anemia, PVD who was transferred here from [facility].  Had  altered mental status.  Had recent admission in April at [facility] for his transmetatarsal amputation, was supposed be on vancomycin and cefepime until 5/31.  His CT head did not show any acute intracranial abnormalities.  Was found to have blood pressure 204/78 in the ER, was started on a Cardene drip which was weaned off overnight. Upon my encounter with him, he remains confused. When asked how he was feeling just kept saying 'good' but unable to answer further questions. He was not even sure where he gets dialysis. When I asked him if it was in [facility] he did reply saying "sounds about right."  Medications:  Current Facility-Administered Medications  Medication Dose Route Frequency Provider Last Rate Last Admin   acetaminophen (TYLENOL) tablet 650 mg  650 mg Oral Q6H PRN Kristopher Oppenheim, DO       Or   acetaminophen (TYLENOL) suppository 650 mg  650 mg Rectal Q6H PRN Kristopher Oppenheim, DO       amLODipine (NORVASC) tablet 10 mg  10 mg Oral Daily Kristopher Oppenheim, DO       ceFEPIme (MAXIPIME) 2 g in sodium chloride 0.9 % 100 mL IVPB  2 g Intravenous Once Bryk, Veronda P, RPH       cloNIDine (CATAPRES) tablet 0.2 mg  0.2 mg Oral Q4H PRN Kristopher Oppenheim, DO       heparin injection 5,000 Units  5,000 Units Subcutaneous Q8H Kristopher Oppenheim, DO   5,000 Units at 11/10/2021 0500   insulin aspart (novoLOG) injection 0-5 Units  0-5 Units Subcutaneous QHS Kristopher Oppenheim, DO       insulin aspart (novoLOG) injection  0-6 Units  0-6 Units Subcutaneous TID WC Kristopher Oppenheim, DO       insulin glargine-yfgn [facility]) injection 10 Units  10 Units Subcutaneous QHS Kristopher Oppenheim, DO   10 Units at 10/20/2021 0453   metoprolol tartrate (LOPRESSOR) tablet 50 mg  50 mg Oral BID Kristopher Oppenheim, DO   50 mg at 11/13/2021 0450   metroNIDAZOLE (FLAGYL) IVPB 500 mg  500 mg Intravenous Q12H Jonetta Osgood, MD       ondansetron [facility]) tablet 4 mg  4 mg Oral Q6H PRN Kristopher Oppenheim, DO       Or   ondansetron [facility]) injection 4 mg  4 mg Intravenous Q6H PRN Kristopher Oppenheim,  DO       vancomycin (VANCOCIN) IVPB 1000 mg/200 mL premix  1,000 mg Intravenous Q T,Th,Sa-HD Bryk, Veronda P, RPH         ALLERGIES Patient has no allergy information on record.  MEDICAL HISTORY Past Medical History:  Diagnosis Date   Chronic osteomyelitis of pelvic region [facility])    ESRD (end stage renal disease) on dialysis ([place])    Essential hypertension    Necrotizing fasciitis ([place])    Type 2 DM with hypertension and ESRD on dialysis [facility])      SOCIAL HISTORY Social History   Socioeconomic History   Marital status: Single    Spouse name: Not on file   Number of children: Not on file   Years of education: Not on file   Highest education level: Not on file  Occupational History   Not on file  Tobacco Use   Smoking status: Former    Packs/day: 0.25    Years: 42.00    Pack years: 10.50    Types: Cigarettes   Smokeless tobacco: Never  Substance and Sexual Activity   Alcohol use: Not Currently   Drug use: Never   Sexual activity: Not on file  Other Topics Concern   Not on file  Social History Narrative   Not on file   Social Determinants of Health   Financial Resource Strain: Not on file  Food Insecurity: Not on file  Transportation Needs: Not on file  Physical Activity: Not on file  Stress: Not on file  Social Connections: Not on file  Intimate Partner Violence: Not on file     FAMILY HISTORY Family History  Problem Relation Age of Onset   Hypertension Father    Heart disease Father    Diabetes Sister    Diabetes Brother    Stroke Paternal Grandmother      Review of Systems: 12 systems were reviewed and negative except per HPI  Physical Exam: Vitals:   10/29/2021 0600 11/10/2021 0730  BP: (!) 164/82 (!) 170/80  Pulse:  84  Resp:  17  Temp:  98.5 F (36.9 C)  SpO2: 97% 99%   No intake/output data recorded. No intake or output data in the 24 hours ending 11/09/2021 0752 General: NAD, awake, confused HEENT: anicteric sclera, MMM CV: tachycardic,  no murmurs, no edema Lungs: cta bl, bilateral chest rise, normal wob Abd: soft, non-tender Ext: right transmetatarsal amputation, no edema Neuro: confused, awake, moves all ext spontaneously  Dialysis access: LUE AVF +b/t  Test Results Reviewed Lab Results  Component Value Date   NA 141 10/22/2021   K 4.4 11/10/2021   CL 102 11/13/2021   CO2 24 10/22/2021   BUN 44 (H) 11/08/2021   CREATININE 8.40 (H) 11/13/2021   CALCIUM 9.8 11/05/2021   ALBUMIN  3.3 (L) 10/25/2021    I have reviewed relevant outside healthcare records

## 2021-11-12 NOTE — Progress Notes (Signed)
Pharmacy Antibiotic Note  Henry Phillips is a 61 y.o. male admitted on 10/27/2021 for malignant hypertension, to complete therapy for  osteomyelitis .  Pharmacy has been consulted for vancomycin and cefepime dosing.  Current planned ABX stop date 5/31.  Plan: Vancomycin 1g and cefepime 2g x1 after HD today, which covers through 5/31; confirm LOT.  Weight: 80.2 kg (176 lb 12.9 oz)  Temp (24hrs), Avg:98.5 F (36.9 C), Min:98.2 F (36.8 C), Max:98.7 F (37.1 C)  Recent Labs  Lab 11/01/2021 0038  WBC 12.8*  CREATININE 8.40*     Thank you for allowing pharmacy to be a part of this patient's care.  Wynona Neat, PharmD, BCPS  10/28/2021 7:14 AM

## 2021-11-12 NOTE — TOC Initial Note (Signed)
Transition of Care [facility]) - Initial/Assessment Note    Patient Details  Name: Henry Phillips MRN: [id number] Date of Birth: [birth year]  Transition of [facility]) CM/SW Contact:    Carles Collet, RN Phone Number: 10/22/2021, 1:39 PM  Clinical Narrative:        At bedside, patient still too confused to provide history. Spoke w sister, [name]. [name] states that he has lived with them for the past 14 years. He has a home health nurse from [facility] named Blanch Media that is managing his amputation site on his foot. Patient has a WC and a ramp, Juliann Pulse and her husband use transportation services for HD, and they also provide transportation for the patient as well. She states she will be able to provide transportation home. [name] states she will be tomorrow around 8:30 to be here to be able to talk to the doctor during rounds.    [place],Cathy Sister   [phone]             Expected Discharge Plan: [place] Barriers to Discharge: Continued Medical Work up   Patient Goals and CMS Choice Patient states their goals for this hospitalization and ongoing recovery are:: patient unable to conversate CMS Medicare.gov Compare Post Acute Care list provided to:: Other (Comment Required) Choice offered to / list presented to : Sibling (sister [place])  Expected Discharge Plan and Services Expected Discharge Plan: [place]   Discharge Planning Services: CM Consult   Living arrangements for the past 2 months: Single Family Home                                      Prior Living Arrangements/Services Living arrangements for the past 2 months: Single Family Home Lives with:: Siblings                   Activities of Daily Living      Permission Sought/Granted                  Emotional Assessment              Admission diagnosis:  Malignant hypertension [I10] Acute encephalopathy [G93.40] Patient Active Problem List    Diagnosis Date Noted   Malignant hypertension 11/04/2021   Hypertensive encephalopathy 10/27/2021   Acute encephalopathy 11/05/2021   Osteomyelitis, unspecified ([place]) 10/08/2021   Peripheral vascular disease ([place]) 07/15/2021   Type 2 DM with hypertension and ESRD on dialysis ([place]) 03/25/2018   Anemia of chronic disease 10/14/2015   ESRD on hemodialysis ([place]) 07/31/2011   Essential hypertension 07/31/2011   PCP:  Pcp, No Pharmacy:  No Pharmacies Listed    Social Determinants of Health (SDOH) Interventions    Readmission Risk Interventions     View : No data to display.

## 2021-11-12 NOTE — Progress Notes (Signed)
EEG complete - results pending 

## 2021-11-12 NOTE — Progress Notes (Signed)
Spoke with Angela Nevin, RN of Hemodialysis. Pt is being moved to tomorrow for HD.

## 2021-11-12 NOTE — Assessment & Plan Note (Signed)
Patient on Lantus insulin according to his last nephrology note.  We will continue with Lantus at 10 units at bedtime and add renal dose sliding scale insulin.  Diabetic/renal diet with fluid restrictions.

## 2021-11-12 NOTE — Assessment & Plan Note (Signed)
Restart his home blood pressure medication including Lopressor 50 mg, Norvasc 10 mg

## 2021-11-12 NOTE — Subjective & Objective (Signed)
CC: altered mental status HPI: 61 year old white male history of end-stage renal disease on hemodialysis via left upper extremity AV fistula, chronic osteomyelitis, recent right transmetatarsal amputation of the foot, hypertension, type 2 diabetes on insulin, anemia chronic kidney disease, history of peripheral vascular disease, transferred from [facility] today.  He was seen in the ER at [facility] today for altered mental status.  No family is available.  Patient unable to give history.  HPI obtained from the chart.  Apparently patient's been acting strange for a couple days.  He goes to dialysis on Tuesday Thursday Saturday.  ALL of his medical care has been with [facility] and with other entities outside of [place].  We have no records for him at [facility].  Also no imaging was sent with the patient today.  It appears the patient had a transmetatarsal amputation back in April 2023 at [facility].  He was placed on antibiotics with vancomycin and cefepime during dialysis days.  End of therapy was supposed to be on Nov 13, 2021.  He showed up to the ER at [facility] today hypertensive.  Admitting vital signs in the ER were temperature 98.2 heart rate 112 blood pressure 204/78 satting 98% on room air.  He was unable to answer any questions.  Lab work at [facility] showed a white count of 9.5 hemoglobin of 11.3, platelets of 182  Sodium 142, potassium 4.5 chloride 98 bicarb of 29, BUN of 43, creatinine 7.4, glucose of 201  Albumin 4.4, AST of 23 ALT of 15 alk phos of 53 total bili 0.7  Lactic acid was normal at 1.4  CT head showed no acute intracranial abnormality but there was apparent motion artifact.  Again no imaging were sent on disc.  Due to the patient's profound malignant hypertension and hypertensive encephalopathy, the patient was started on a Cardene drip.  Unclear if the ER attempted to try to transfer him to another facility other than [place].  [facility] was contacted.  Patient excepted to them progressive bed.  Patient was started on Cardene at 5 mg/h and rapidly uptitrated to 14 mg/h.  His blood pressure was improved.  He was transferred out of [facility] ER on a Cardene drip.  He arrived to [facility] on the progressive floor on a Cardene drip.

## 2021-11-12 NOTE — Progress Notes (Signed)
Non violent restraints placed. IM haldol administered. IV team placed IV. MRI, HD, Ghimire,MD, and charge RN aware of situation.

## 2021-11-13 DIAGNOSIS — N186 End stage renal disease: Secondary | ICD-10-CM | POA: Diagnosis not present

## 2021-11-13 DIAGNOSIS — Z992 Dependence on renal dialysis: Secondary | ICD-10-CM | POA: Diagnosis not present

## 2021-11-13 DIAGNOSIS — I10 Essential (primary) hypertension: Secondary | ICD-10-CM | POA: Diagnosis not present

## 2021-11-13 DIAGNOSIS — G9340 Encephalopathy, unspecified: Secondary | ICD-10-CM | POA: Diagnosis not present

## 2021-11-13 DIAGNOSIS — D638 Anemia in other chronic diseases classified elsewhere: Secondary | ICD-10-CM | POA: Diagnosis not present

## 2021-11-13 LAB — GLUCOSE, CAPILLARY
Glucose-Capillary: 113 mg/dL — ABNORMAL HIGH (ref 70–99)
Glucose-Capillary: 103 mg/dL — ABNORMAL HIGH (ref 70–99)
Glucose-Capillary: 119 mg/dL — ABNORMAL HIGH (ref 70–99)
Glucose-Capillary: 145 mg/dL — ABNORMAL HIGH (ref 70–99)

## 2021-11-13 LAB — CBC
HCT: 33 % — ABNORMAL LOW (ref 39.0–52.0)
Hemoglobin: 11.1 g/dL — ABNORMAL LOW (ref 13.0–17.0)
MCH: 31 pg (ref 26.0–34.0)
MCHC: 33.6 g/dL (ref 30.0–36.0)
MCV: 92.2 fL (ref 80.0–100.0)
Platelets: 198 10*3/uL (ref 150–400)
RBC: 3.58 MIL/uL — ABNORMAL LOW (ref 4.22–5.81)
RDW: 14 % (ref 11.5–15.5)
WBC: 12 10*3/uL — ABNORMAL HIGH (ref 4.0–10.5)
nRBC: 0 % (ref 0.0–0.2)

## 2021-11-13 LAB — VITAMIN B12: Vitamin B-12: 526 pg/mL (ref 180–914)

## 2021-11-13 LAB — RENAL FUNCTION PANEL
Albumin: 3.1 g/dL — ABNORMAL LOW (ref 3.5–5.0)
Anion gap: 17 — ABNORMAL HIGH (ref 5–15)
BUN: 53 mg/dL — ABNORMAL HIGH (ref 8–23)
CO2: 20 mmol/L — ABNORMAL LOW (ref 22–32)
Calcium: 9.9 mg/dL (ref 8.9–10.3)
Chloride: 104 mmol/L (ref 98–111)
Creatinine, Ser: 10.18 mg/dL — ABNORMAL HIGH (ref 0.61–1.24)
GFR, Estimated: 5 mL/min — ABNORMAL LOW (ref >60)
Glucose, Bld: 173 mg/dL — ABNORMAL HIGH (ref 70–99)
Phosphorus: 4.6 mg/dL (ref 2.5–4.6)
Potassium: 4 mmol/L (ref 3.5–5.1)
Sodium: 141 mmol/L (ref 135–145)

## 2021-11-13 LAB — AMMONIA: Ammonia: 26 umol/L (ref 9–35)

## 2021-11-13 LAB — TSH: TSH: 1.489 u[IU]/mL (ref 0.350–4.500)

## 2021-11-13 LAB — HEPATITIS B SURFACE ANTIBODY, QUANTITATIVE: Hep B S AB Quant (Post): 21.7 m[IU]/mL (ref >9.9)

## 2021-11-13 MED ORDER — LORAZEPAM 2 MG/ML IJ SOLN: 1.0000 mg | Freq: Four times a day (QID) | INTRAMUSCULAR | Status: DC | PRN

## 2021-11-13 MED ORDER — LIDOCAINE-PRILOCAINE 2.5-2.5 % EX CREA: 1.0000 "application " | TOPICAL_CREAM | CUTANEOUS | Status: DC | PRN

## 2021-11-13 MED ORDER — LIDOCAINE HCL (PF) 1 % IJ SOLN: 5.0000 mL | INTRAMUSCULAR | Status: DC | PRN

## 2021-11-13 MED ORDER — HEPARIN SODIUM (PORCINE) 1000 UNIT/ML DIALYSIS: 1000.0000 [IU] | INTRAMUSCULAR | Status: DC | PRN

## 2021-11-13 MED ORDER — ALTEPLASE 2 MG IJ SOLR: 2.0000 mg | Freq: Once | INTRAMUSCULAR | Status: DC | PRN

## 2021-11-13 MED ORDER — ANTICOAGULANT SODIUM CITRATE 4% (200MG/5ML) IV SOLN: 5.0000 mL | Status: DC | PRN

## 2021-11-13 MED ORDER — PENTAFLUOROPROP-TETRAFLUOROETH EX AERO: 1.0000 "application " | INHALATION_SPRAY | CUTANEOUS | Status: DC | PRN

## 2021-11-13 MED ORDER — ALBUMIN HUMAN 25 % IV SOLN
INTRAVENOUS | Status: DC
Start: 2021-11-13 — End: 2021-11-13
  Filled 2021-11-13: qty 100

## 2021-11-13 NOTE — Progress Notes (Signed)
Pt is currently unavailable for EEG. Pt will be going to Dialysis. Will attempt later when our schedule permits

## 2021-11-13 NOTE — Progress Notes (Signed)
PROGRESS NOTE        PATIENT DETAILS Name: Merdith Boyd Age: 61 y.o. Sex: male Date of Birth: Feb 26, 1961 Admit Date: 10/30/2021 Admitting Physician Evalee Mutton Kristeen Mans, MD PCP:Pcp, No  Brief Summary: Patient is a 61 y.o.  male with recent history of right foot osteomyelitis-s/p TMA on 4/20 at [facility] IV Vanco/cefepime/Flagyl until 5/31-ESRD on HD TTS-admitted for evaluation of confusion and malignant hypertension.  Significant events: 5/30>> transfer from [facility] for evaluation of confusion/uncontrolled hypertension.  Transferred on IV Cardene.  Significant studies: 5/29>> CT head (done at [facility]): No acute abnormalities. 5/29>> x-ray right/left foot (done at [facility]): No obvious osteomyelitis 5/30>> MRI brain: No acute intracranial abnormality 5/30>> Spot EEG: Negative for seizures. 5/31>> B12: 526 5/31>> ammonia: Within normal limits 5/31>> TSH: Pending 5/31>> vitamin B1: Pending  Significant microbiology data: None  Procedures: None  Consults: Nephrology  Subjective: Remains confused-awake-tells me his name but then keeps repeating "I am doing good"  Objective: Vitals: Blood pressure (!) 149/73, pulse 90, temperature 97.7 F (36.5 C), temperature source Axillary, resp. rate 16, weight 79.3 kg, SpO2 97 %.   Exam: Gen Exam: Confused-not in any distress. HEENT:atraumatic, normocephalic Chest: B/L clear to auscultation anteriorly CVS:S1S2 regular Abdomen:soft non tender, non distended Extremities:no edema Neurology: Moving all 4 extremities. Skin: no rash   Pertinent Labs/Radiology:    Latest Ref Rng & Units 11/13/2021    2:24 AM 10/28/2021   12:38 AM  CBC  WBC 4.0 - 10.5 K/uL 12.0   12.8    Hemoglobin 13.0 - 17.0 g/dL 11.1   11.5    Hematocrit 39.0 - 52.0 % 33.0   33.4    Platelets 150 - 400 K/uL 198   205      Lab Results  Component Value Date   NA 141 11/13/2021   K 4.0 11/13/2021   CL 104  11/13/2021   CO2 20 (L) 11/13/2021       Assessment/Plan: Acute metabolic encephalopathy: Initially concern for PRES/hypertensive encephalopathy-however MRI brain negative for PRES-given that he has been on cefepime for a while-suspicion now for cefepime induced nephrotoxicity.  No fever/neck stiffness-doubt infectious etiology at this time.  Neurology consulted-awaiting further recommendations.  Hypertensive emergency: Was on nicardipine infusion at [facility] ED-blood pressure better controlled-continue amlodipine/metoprolol.  Per sister-patient's blood pressure usually runs on the lower side.  Watch closely.   Osteomyelitis of right foot-s/p right TMA by podiatry at [facility] on 10/03/2021: Amputation site looks benign-Per review of discharge summary-last day for Vanco/cefepime/Flagyl 5/31.  ESRD: Nephrology consulted for HD.  Normocytic anemia: Mild-follow hemoglobin-defer Aranesp/IV iron to nephrology  PAD: Continue to hold antiplatelets-resume once MRI brain has been completed.  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  Recent Labs    10/17/2021 1559 10/15/2021 2101 11/13/21 0958  GLUCAP 133* 131* 113*      Code status:   Code Status: Full Code   DVT Prophylaxis: heparin injection 5,000 Units Start: 11/04/2021 0600 SCDs Start: 10/16/2021 0027   Family Communication: sister [name] [phone] updated over the phone on 5/30   Disposition Plan: Status is: Observation The patient will require care spanning > 2 midnights and should be moved to inpatient because: Persistent encephalopathy-getting further work-up-MRI/EEG-not stable for discharge.   Planned Discharge Destination:Home health   Diet:  Diet Order             Diet renal/carb modified with fluid restriction Diet-HS Snack? Nothing; Fluid restriction: 1200 mL Fluid; Room service appropriate? Yes; Fluid consistency: Thin  Diet effective now                      Antimicrobial agents: Anti-infectives (From admission, onward)    Start     Dose/Rate Route Frequency Ordered Stop   11/13/21 1200  vancomycin (VANCOCIN) IVPB 1000 mg/200 mL premix        1,000 mg 200 mL/hr over 60 Minutes Intravenous Every M-W-F (Hemodialysis) 10/17/2021 2000 11/15/21 1159   10/14/2021 2200  metroNIDAZOLE (FLAGYL) tablet 500 mg        500 mg Oral Every 12 hours 10/15/2021 1021 11/14/21 0959   10/27/2021 1600  ceFEPIme (MAXIPIME) 2 g in sodium chloride 0.9 % 100 mL IVPB        2 g 200 mL/hr over 30 Minutes Intravenous  Once 10/14/2021 0713 11/10/2021 2056   11/11/2021 1200  vancomycin (VANCOCIN) IVPB 1000 mg/200 mL premix  Status:  Discontinued        1,000 mg 200 mL/hr over 60 Minutes Intravenous Every T-Th-Sa (Hemodialysis) 11/08/2021 0713 10/28/2021 1958   10/23/2021 0800  metroNIDAZOLE (FLAGYL) IVPB 500 mg  Status:  Discontinued        500 mg 100 mL/hr over 60 Minutes Intravenous Every 12 hours 10/15/2021 0705 10/25/2021 1021        MEDICATIONS: Scheduled Meds:  amLODipine  10 mg Oral Daily   Chlorhexidine Gluconate Cloth  6 each Topical Q0600   doxercalciferol  3 mcg Intravenous Q T,Th,Sa-HD   doxercalciferol  3 mcg Intravenous Q M,W,F-HD   heparin  5,000 Units Subcutaneous Q8H   insulin aspart  0-5 Units Subcutaneous QHS   insulin aspart  0-6 Units Subcutaneous TID WC   insulin glargine-yfgn  10 Units Subcutaneous QHS   metoprolol tartrate  50 mg Oral BID   metroNIDAZOLE  500 mg Oral Q12H   QUEtiapine  50 mg Oral QHS   Continuous Infusions:  vancomycin     PRN Meds:.acetaminophen **OR** acetaminophen, haloperidol lactate **OR** haloperidol lactate, hydrALAZINE, labetalol, LORazepam, ondansetron **OR** ondansetron (ZOFRAN) IV   I have personally reviewed following labs and imaging studies  LABORATORY DATA: CBC: Recent Labs  Lab 10/27/2021 0038 11/13/21 0224  WBC 12.8* 12.0*  NEUTROABS 9.2*  --   HGB 11.5* 11.1*  HCT 33.4* 33.0*  MCV 91.5 92.2  PLT 205 198      Basic Metabolic Panel: Recent Labs  Lab 10/24/2021 0038 11/13/21 0224  NA 141 141  K 4.4 4.0  CL 102 104  CO2 24 20*  GLUCOSE 212* 173*  BUN 44* 53*  CREATININE 8.40* 10.18*  CALCIUM 9.8 9.9  MG 2.1  --   PHOS  --  4.6     GFR: CrCl cannot be calculated (Unknown ideal weight.).  Liver Function Tests: Recent Labs  Lab 10/30/2021 0038 11/13/21 0224  AST 16  --   ALT 13  --   ALKPHOS 33*  --   BILITOT 0.7  --   PROT 7.7  --   ALBUMIN 3.3* 3.1*    No results for input(s): LIPASE, AMYLASE in the last 168 hours. Recent Labs  Lab 11/13/21 0614  AMMONIA 26    Coagulation Profile: No results for input(s): INR, PROTIME in the last 168 hours.  Cardiac Enzymes: No results for input(s): CKTOTAL, CKMB,  CKMBINDEX, TROPONINI in the last 168 hours.  BNP (last 3 results) No results for input(s): PROBNP in the last 8760 hours.  Lipid Profile: No results for input(s): CHOL, HDL, LDLCALC, TRIG, CHOLHDL, LDLDIRECT in the last 72 hours.  Thyroid Function Tests: No results for input(s): TSH, T4TOTAL, FREET4, T3FREE, THYROIDAB in the last 72 hours.  Anemia Panel: Recent Labs    11/13/21 0614  VITAMINB12 526    Urine analysis: No results found for: COLORURINE, APPEARANCEUR, LABSPEC, PHURINE, GLUCOSEU, HGBUR, BILIRUBINUR, KETONESUR, PROTEINUR, UROBILINOGEN, NITRITE, LEUKOCYTESUR  Sepsis Labs: Lactic Acid, Venous No results found for: LATICACIDVEN  MICROBIOLOGY: No results found for this or any previous visit (from the past 240 hour(s)).  RADIOLOGY STUDIES/RESULTS: MR BRAIN WO CONTRAST  Result Date: 11/01/2021 CLINICAL DATA:  Initial evaluation for mental status change, unknown cause. EXAM: MRI HEAD WITHOUT CONTRAST TECHNIQUE: Multiplanar, multiecho pulse sequences of the brain and surrounding structures were obtained without intravenous contrast. COMPARISON:  None Available. FINDINGS: Brain: Examination moderately degraded by motion artifact. Cerebral volume  within normal limits. Mild chronic microvascular ischemic disease noted involving the periventricular and deep white matter both cerebral hemispheres. 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 No evidence for acute or subacute ischemia. Gray-white matter differentiation maintained. No areas of chronic cortical infarction. No visible acute or chronic intracranial blood products. No mass lesion, mass effect or midline shift. No hydrocephalus or extra-axial fluid collection. Pituitary gland suprasellar region within normal limits. Midline structures intact and normally formed. Vascular: Major intracranial vascular flow voids are grossly maintained at the skull base. Skull and upper cervical spine: Craniocervical junction within normal limits. Bone marrow signal intensity normal. No scalp soft tissue abnormality. Sinuses/Orbits: Prior bilateral ocular lens replacement. Scattered mucosal thickening noted throughout the paranasal sinuses. No significant mastoid effusion. Other: None. IMPRESSION: 1. No acute intracranial abnormality. 2. Mild chronic microvascular ischemic disease for age. Electronically Signed   By: Jeannine Boga M.D.   On: 11/06/2021 19:14   EEG adult  Result Date: 11/13/2021 Lora Havens, MD     11/05/2021  2:26 PM Patient Name: Rosalie Gelpi MRN: [id number] Epilepsy Attending: Lora Havens Referring Physician/Provider: Jonetta Osgood, MD Date: 11/03/2021 Duration: 21.29 mins Patient history: 61 year old male with altered mental status.  EEG to evaluate for seizure. Level of alertness: Awake AEDs during EEG study: None Technical aspects: This EEG study was done with scalp electrodes positioned according to the 10-20 International system of electrode placement. Electrical activity was acquired at a sampling rate of 500Hz  and reviewed with a high frequency filter of 70Hz  and a low frequency filter of 1Hz . EEG data were recorded continuously and  digitally stored. Description: No clear posterior dominant rhythm was seen.  EEG showed continuous generalized predominantly 6 to 9 Hz theta-alpha activity admixed with intermittent generalized 2 to 3 Hz delta slowing which at times appears sharply contoured.  Hyperventilation and photic stimulation were not performed.   ABNORMALITY - Continuous slow, generalized IMPRESSION: This study is suggestive of moderate diffuse encephalopathy, nonspecific etiology. No seizures or epileptiform discharges were seen throughout the recording. [place]     LOS: 1 day   Oren Binet, MD  [facility]    To contact the attending provider between 7A-7P or the covering provider during after hours 7P-7A, please log into the web site [email or web address] and access using universal [place] password for that web site. If you do not have the password, please call the hospital operator.  11/13/2021, 10:50 AM

## 2021-11-13 NOTE — Progress Notes (Signed)
Arrived at patient room for EEG.  Sitter in room for patient safely.   Patient confused not following commands and very restless.  Hanging body over both sides of his bed.  Constantly moving around on bed. RN notified. Will attempt again tomorrow. Possibility of two staff members will be needed to do testing.

## 2021-11-13 NOTE — Progress Notes (Addendum)
This rn called report to 5W nurse, nurse informed pt drowsy/sleepy with episodes of responding to voice during tx. Nurse informed dr Candiss Norse aware pt with episodes of hypotension during tx. Pt arousable vss and hemostasis at a/v fistula site achieved at end of tx.

## 2021-11-13 NOTE — Evaluation (Signed)
Occupational Therapy Evaluation Patient Details Name: Henry Phillips MRN: [id number] DOB: Oct 31, 1960 Today's Date: 11/13/2021   History of Present Illness Pt is a 61 y.o. male admitted 10/18/2021 with confusion, uncontrolled HTN. Head CT negative for acute abnormality. Workup for hypertensive urgency, acute metabolic encephalopathy with concern for PRES. EEG suggestive of moderate diffuse encephalopathy; no seizures seen. Brain MRI negative. PMH includes PAD, R foot osteomyelitis s/p TMA (10/03/21), ESRD (HD TTS), DM2   Clinical Impression   Limited evaluation with pt becoming agitated and demanding, "Leave me alone." He is currently dependent in all ADLs, restless and oriented to self only. He freely moves all extremities and performs bed mobility with supervision. Sitter at bedside. Will follow acutely and update discharge recommendations as pt is able/willing to participate.      Recommendations for follow up therapy are one component of a multi-disciplinary discharge planning process, led by the attending physician.  Recommendations may be updated based on patient status, additional functional criteria and insurance authorization.   Follow Up Recommendations  Other (comment) (to be determined as cognition improves)    Assistance Recommended at Discharge Frequent or constant Supervision/Assistance  Patient can return home with the following A lot of help with bathing/dressing/bathroom;Direct supervision/assist for medications management;Assistance with cooking/housework;Direct supervision/assist for financial management;Assist for transportation;Help with stairs or ramp for entrance;Assistance with feeding    Functional Status Assessment  Patient has had a recent decline in their functional status and/or demonstrates limited ability to make significant improvements in function in a reasonable and predictable amount of time  Equipment Recommendations  Other (comment) (to be determined)     Recommendations for Other Services       Precautions / Restrictions Precautions Precautions: Fall;Other (comment) Precaution Comments: wrist restraints Restrictions Weight Bearing Restrictions: No      Mobility Bed Mobility               General bed mobility comments: pt moving about his bed restlessly    Transfers                   General transfer comment: deferred due to agitation      Balance                                           ADL either performed or assessed with clinical judgement   ADL                                         General ADL Comments: Pt currently total assist, per sitter refusing to eat.     Vision         Perception     Praxis      Pertinent Vitals/Pain Pain Assessment Pain Assessment: Faces Faces Pain Scale: No hurt     Hand Dominance     Extremity/Trunk Assessment Upper Extremity Assessment Upper Extremity Assessment: Overall WFL for tasks assessed;Difficult to assess due to impaired cognition   Lower Extremity Assessment Lower Extremity Assessment: Defer to PT evaluation       Communication Communication Communication: Expressive difficulties   Cognition Arousal/Alertness: Awake/alert (closes eyes when not stimulated) Behavior During Therapy: Agitated, Restless Overall Cognitive Status: Impaired/Different from baseline Area of Impairment: Orientation, Attention, Following commands, Safety/judgement, Awareness, Problem solving  Orientation Level: Disoriented to, Place, Time, Situation Current Attention Level: Focused   Following Commands:  (not following commands) Safety/Judgement: Decreased awareness of safety, Decreased awareness of deficits Awareness: Intellectual Problem Solving: Decreased initiation General Comments: pt becoming increasingly agitated with requests/questions     General Comments       Exercises     Shoulder  Instructions      Home Living Family/patient expects to be discharged to:: Private residence Living Arrangements: Other relatives (sister) Available Help at Discharge: Family Type of Home: House Home Access: Ramped entrance     Home Layout: Two level               Home Equipment: Wheelchair - manual   Additional Comments: pt only able/willing to share sister's name, information taken from PT evaluation      Prior Functioning/Environment Prior Level of Function : Patient poor historian/Family not available;Independent/Modified Independent             Mobility Comments: pt reports indep without DME; unsure reliable info. per CM note, sister reports having w/c and ramp; pt has [employer]) who manages R foot amputation site; family drives pt, transportation provided for HD appts          OT Problem List: Decreased cognition;Decreased safety awareness      OT Treatment/Interventions: Self-care/ADL training;DME and/or AE instruction;Therapeutic activities;Cognitive remediation/compensation;Patient/family education;Balance training    OT Goals(Current goals can be found in the care plan section) Acute Rehab OT Goals OT Goal Formulation: Patient unable to participate in goal setting Time For Goal Achievement: 11/27/21 Potential to Achieve Goals: Fair ADL Goals Pt Will Perform Eating: with set-up;sitting Pt Will Perform Grooming: with supervision;standing Pt Will Perform Upper Body Dressing: with supervision;sitting Pt Will Perform Lower Body Dressing: with supervision;sit to/from stand Pt Will Transfer to Toilet: with supervision;ambulating;regular height toilet Pt Will Perform Toileting - Clothing Manipulation and hygiene: with supervision;sit to/from stand Additional ADL Goal #1: Pt will follow one step commands with 75% accuracy.  OT Frequency: Min 2X/week    Co-evaluation              AM-PAC OT "6 Clicks" Daily Activity     Outcome Measure Help from  another person eating meals?: Total Help from another person taking care of personal grooming?: Total Help from another person toileting, which includes using toliet, bedpan, or urinal?: Total Help from another person bathing (including washing, rinsing, drying)?: Total Help from another person to put on and taking off regular upper body clothing?: Total Help from another person to put on and taking off regular lower body clothing?: Total 6 Click Score: 6   End of Session    Activity Tolerance: Treatment limited secondary to agitation Patient left: in bed;with call bell/phone within reach;with bed alarm set;with nursing/sitter in room  OT Visit Diagnosis: Other symptoms and signs involving cognitive function                Time: 1003-1017 OT Time Calculation (min): 14 min Charges:  OT General Charges $OT Visit: 1 Visit OT Evaluation $OT Eval Moderate Complexity: 1 Mod  Nestor Lewandowsky, OTR/L Acute Rehabilitation Services Pager: [phone] Office: [phone]  Malka So 11/13/2021, 10:25 AM

## 2021-11-13 NOTE — Progress Notes (Signed)
Pt arrived with safety sitter

## 2021-11-13 NOTE — Progress Notes (Signed)
[facility] Progress Note    Assessment/ Plan:   ESRD:  -Outpatient orders: [place], TTS, 3 hours 45 minutes, F1 80, BFR 450/autoflow 1.5, EDW 85kg, 2K, 3Ca.  Meds: Hectorol 3 mcg q. treatment, heparin 2000 units bolus, 1000 units mid run -Off schedule. HD today, will tentatively plan for his next HD tomorrow vs Friday   Acute metabolic encephalopathy -MRI without any abnormalities. Per primary. Possible cefepime induced neurotoxicity. Neuro consulted  Volume/ hypertension -EDW 85 kg . Uf'ing as tolerated -Resume home meds, will see how he does with his blood pressure after dialysis.  If needed, can keep him on p.o. clonidine  Osteomyelitis, chronic -per primary   Anemia of Chronic Kidney Disease:  -Hemoglobin 11.5, not receiving any iron or ESA's as an outpatient.  Monitor for now.     Secondary Hyperparathyroidism/Hyperphosphatemia: Resume home sevelamer, Sensipar. Hectorol resumed  DM2 -Management per primary service   Vascular access: Left upper extremity aVF    Subjective:   Has been requiring restraints for safety. Sitter at bedside. Remains confused. ROS unobtainable   Objective:   BP (!) 158/101   Pulse 82   Temp 98.3 F (36.8 C) (Temporal)   Resp 16   Wt 79.7 kg   SpO2 97%  No intake or output data in the 24 hours ending 11/13/21 1140 Weight change: -0.9 kg  Physical Exam: EGB:TDVVOHYW but NAD, laying flat in bed CVS:rrr, s1s2 Resp:cta bl VPX:TGGY, nt IRS:WNIOEVOJ, moves all ext spontaneously, in restraints Dialysis access: LUE AVF +b/t  Imaging: MR BRAIN WO CONTRAST  Result Date: 11/11/2021 CLINICAL DATA:  Initial evaluation for mental status change, unknown cause. EXAM: MRI HEAD WITHOUT CONTRAST TECHNIQUE: Multiplanar, multiecho pulse sequences of the brain and surrounding structures were obtained without intravenous contrast. COMPARISON:  None Available. FINDINGS: Brain: Examination moderately degraded by motion artifact.  Cerebral volume within normal limits. Mild chronic microvascular ischemic disease noted involving the periventricular and deep white matter both cerebral hemispheres. 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 No evidence for acute or subacute ischemia. Gray-white matter differentiation maintained. No areas of chronic cortical infarction. No visible acute or chronic intracranial blood products. No mass lesion, mass effect or midline shift. No hydrocephalus or extra-axial fluid collection. Pituitary gland suprasellar region within normal limits. Midline structures intact and normally formed. Vascular: Major intracranial vascular flow voids are grossly maintained at the skull base. Skull and upper cervical spine: Craniocervical junction within normal limits. Bone marrow signal intensity normal. No scalp soft tissue abnormality. Sinuses/Orbits: Prior bilateral ocular lens replacement. Scattered mucosal thickening noted throughout the paranasal sinuses. No significant mastoid effusion. Other: None. IMPRESSION: 1. No acute intracranial abnormality. 2. Mild chronic microvascular ischemic disease for age. Electronically Signed   By: Jeannine Boga M.D.   On: 11/11/2021 19:14   EEG adult  Result Date: 11/09/2021 Lora Havens, MD     10/14/2021  2:26 PM Patient Name: Henry Phillips MRN: [id number] Epilepsy Attending: Lora Havens Referring Physician/Provider: Jonetta Osgood, MD Date: 10/31/2021 Duration: 21.29 mins Patient history: 61 year old male with altered mental status.  EEG to evaluate for seizure. Level of alertness: Awake AEDs during EEG study: None Technical aspects: This EEG study was done with scalp electrodes positioned according to the 10-20 International system of electrode placement. Electrical activity was acquired at a sampling rate of 500Hz  and reviewed with a high frequency filter of 70Hz  and a low frequency filter of 1Hz . EEG data were recorded  continuously and digitally stored. Description: No clear posterior dominant rhythm was seen.  EEG showed continuous generalized predominantly 6 to 9 Hz theta-alpha activity admixed with intermittent generalized 2 to 3 Hz delta slowing which at times appears sharply contoured.  Hyperventilation and photic stimulation were not performed.   ABNORMALITY - Continuous slow, generalized IMPRESSION: This study is suggestive of moderate diffuse encephalopathy, nonspecific etiology. No seizures or epileptiform discharges were seen throughout the recording. [place]: BMET Recent Labs  Lab 11/01/2021 0038 11/13/21 0224  NA 141 141  K 4.4 4.0  CL 102 104  CO2 24 20*  GLUCOSE 212* 173*  BUN 44* 53*  CREATININE 8.40* 10.18*  CALCIUM 9.8 9.9  PHOS  --  4.6   CBC Recent Labs  Lab 10/16/2021 0038 11/13/21 0224  WBC 12.8* 12.0*  NEUTROABS 9.2*  --   HGB 11.5* 11.1*  HCT 33.4* 33.0*  MCV 91.5 92.2  PLT 205 198    Medications:     amLODipine  10 mg Oral Daily   Chlorhexidine Gluconate Cloth  6 each Topical Q0600   doxercalciferol  3 mcg Intravenous Q T,Th,Sa-HD   doxercalciferol  3 mcg Intravenous Q M,W,F-HD   heparin  5,000 Units Subcutaneous Q8H   insulin aspart  0-5 Units Subcutaneous QHS   insulin aspart  0-6 Units Subcutaneous TID WC   insulin glargine-yfgn  10 Units Subcutaneous QHS   metoprolol tartrate  50 mg Oral BID   metroNIDAZOLE  500 mg Oral Q12H   QUEtiapine  50 mg Oral QHS      Gean Quint, MD [facility] 11/13/2021, 11:40 AM

## 2021-11-13 NOTE — Consult Note (Signed)
Neurology Consultation  Reason for Consult: Altered mental status Referring Physician: Dr Sloan Leiter CC: Confusion, altered mental status History is obtained from:Chart, sister over the phone  HPI: Henry Phillips is a 61 y.o. male past medical history of ESRD on hemodialysis, chronic osteomyelitis with recent right metatarsal amputation of the foot, hypertension, type 2 diabetes, peripheral vascular disease transferred from an outside hospital for evaluation of altered mental status.  Most of his care has been at [facility] and records are in care everywhere. Last known time Saturday according to the family when he started feeling somewhat sick with flulike symptoms but no fevers.  He also became very confused and altered which is unlike him-at baseline he is completely normal according to the sister. He is on pain medications.  He goes to his dialysis regularly according to the sister. He has been on vancomycin and cefepime after his transmetatarsal amputation in April 2023 at [facility]. He was noted to be extremely hypertensive at the outside ER.  Head imaging reportedly was normal.  No leukocytosis.  Creatinine 7.4, BUN 43, glucose 201. He was started on Cardene drip for hypertensive urgency and likely hypertensive encephalopathy but his mentation remains off of his baseline for which neurological consultation was obtained.  LKW: 11/09/2021 tpa given?: no, outside the window Premorbid modified Rankin scale (mRS): 2  ASN:[id number] to obtain due to altered mental status.   Past Medical History:  Diagnosis Date   Chronic osteomyelitis of pelvic region ([place])    ESRD (end stage renal disease) on dialysis ([place])    Essential hypertension    Necrotizing fasciitis ([place])    Type 2 DM with hypertension and ESRD on dialysis [facility])    Family History  Problem Relation Age of Onset   Hypertension Father    Heart disease Father    Diabetes Sister    Diabetes Brother    Stroke Paternal Grandmother    Social  History:   reports that he has quit smoking. His smoking use included cigarettes. He has a 10.50 pack-year smoking history. He has never used smokeless tobacco. He reports that he does not currently use alcohol. He reports that he does not use drugs.  Medications  Current Facility-Administered Medications:    acetaminophen (TYLENOL) tablet 650 mg, 650 mg, Oral, Q6H PRN **OR** acetaminophen (TYLENOL) suppository 650 mg, 650 mg, Rectal, Q6H PRN, Kristopher Oppenheim, DO   amLODipine (NORVASC) tablet 10 mg, 10 mg, Oral, Daily, Kristopher Oppenheim, DO, 10 mg at 10/28/2021 7341   Chlorhexidine Gluconate Cloth 2 % PADS 6 each, 6 each, Topical, Q0600, Gean Quint, MD, 6 each at 11/11/2021 0911   doxercalciferol (HECTOROL) injection 3 mcg, 3 mcg, Intravenous, Q T,Th,Sa-HD, Gean Quint, MD   doxercalciferol (HECTOROL) injection 3 mcg, 3 mcg, Intravenous, Q M,W,F-HD, Pierce, Dwayne A, RPH   haloperidol lactate (HALDOL) injection 4 mg, 4 mg, Intravenous, Q6H PRN **OR** haloperidol lactate (HALDOL) injection 4 mg, 4 mg, Intramuscular, Q6H PRN, Ghimire, Henreitta Leber, MD   heparin injection 5,000 Units, 5,000 Units, Subcutaneous, Q8H, Kristopher Oppenheim, DO, 5,000 Units at 10/21/2021 2100   hydrALAZINE (APRESOLINE) injection 10 mg, 10 mg, Intravenous, Q6H PRN, Ghimire, Henreitta Leber, MD   insulin aspart (novoLOG) injection 0-5 Units, 0-5 Units, Subcutaneous, QHS, Kristopher Oppenheim, DO   insulin aspart (novoLOG) injection 0-6 Units, 0-6 Units, Subcutaneous, TID WC, Kristopher Oppenheim, DO, 1 Units at 10/14/2021 1659   insulin glargine-yfgn (SEMGLEE) injection 10 Units, 10 Units, Subcutaneous, QHS, Kristopher Oppenheim, DO, 10 Units at 10/23/2021 2200  labetalol (NORMODYNE) injection 10 mg, 10 mg, Intravenous, Q2H PRN, Ghimire, Henreitta Leber, MD   LORazepam (ATIVAN) injection 1 mg, 1 mg, Intravenous, Q6H PRN, Sloan Leiter, Henreitta Leber, MD   metoprolol tartrate (LOPRESSOR) tablet 50 mg, 50 mg, Oral, BID, Kristopher Oppenheim, DO, 50 mg at 10/26/2021 2100   metroNIDAZOLE (FLAGYL) tablet 500 mg, 500 mg,  Oral, Q12H, Donnamae Jude, RPH, 500 mg at 11/13/2021 2100   ondansetron (ZOFRAN) tablet 4 mg, 4 mg, Oral, Q6H PRN **OR** ondansetron (ZOFRAN) injection 4 mg, 4 mg, Intravenous, Q6H PRN, Kristopher Oppenheim, DO   QUEtiapine (SEROQUEL) tablet 50 mg, 50 mg, Oral, QHS, Ghimire, Henreitta Leber, MD, 50 mg at 10/24/2021 2100   vancomycin (VANCOCIN) IVPB 1000 mg/200 mL premix, 1,000 mg, Intravenous, Q M,W,F-HD, Levada Dy, Dwayne A, RPH  Exam: Current vital signs: BP (!) 149/73 (BP Location: Right Arm)   Pulse 90   Temp 97.7 F (36.5 C) (Axillary)   Resp 16   Wt 79.3 kg   SpO2 97%  Vital signs in last 24 hours: Temp:  [97.7 F (36.5 C)-98.4 F (36.9 C)] 97.7 F (36.5 C) (05/31 0400) Pulse Rate:  [77-110] 90 (05/31 0731) Resp:  [16-17] 16 (05/31 0731) BP: (132-180)/(72-107) 149/73 (05/31 0731) SpO2:  [97 %-100 %] 97 % (05/31 0731) Weight:  [79.3 kg] 79.3 kg (05/31 0400) Gen: awake, alert, not in distress HEENT: [place] AT MMM CVS: S1S2+, RRR Resp: breathing well and saturating well on RA Abd: ND NT NEUROLOGICAL EXAM Awake, alert, not oriented to person place or time Perseverating - "I am ok" to every question. Does follow simple commands like lifting arms up and legs up. Can not follow two step commands. Speech is clear CN: PERRL, EOMI, VFF, face symmetric, facial sensation difficult to assess, tongue/palate midline Motor: moves all 4 against gravity with full strength Sensory: intact all over Coordination: has asterixis on outstretched arms NIHSS 1a Level of Conscious.: 0 1b LOC Questions: 2 1c LOC Commands: 0 2 Best Gaze: 0 3 Visual: 0 4 Facial Palsy: 0 5a Motor Arm - left: 0 5b Motor Arm - Right: 0 6a Motor Leg - Left: 0 6b Motor Leg - Right: 0 7 Limb Ataxia: 0 8 Sensory: 0 9 Best Language: 2 10 Dysarthria: 0 11 Extinct. and Inatten.: 0 TOTAL: 4  Labs I have reviewed labs in epic and the results pertinent to this consultation are: CBC    Component Value Date/Time   WBC 12.0 (H)  11/13/2021 0224   RBC 3.58 (L) 11/13/2021 0224   HGB 11.1 (L) 11/13/2021 0224   HCT 33.0 (L) 11/13/2021 0224   PLT 198 11/13/2021 0224   MCV 92.2 11/13/2021 0224   MCH 31.0 11/13/2021 0224   MCHC 33.6 11/13/2021 0224   RDW 14.0 11/13/2021 0224   LYMPHSABS 1.9 10/28/2021 0038   MONOABS 1.3 (H) 10/27/2021 0038   EOSABS 0.2 11/13/2021 0038   BASOSABS 0.1 11/11/2021 0038  CMP     Component Value Date/Time   NA 141 11/13/2021 0224   K 4.0 11/13/2021 0224   CL 104 11/13/2021 0224   CO2 20 (L) 11/13/2021 0224   GLUCOSE 173 (H) 11/13/2021 0224   BUN 53 (H) 11/13/2021 0224   CREATININE 10.18 (H) 11/13/2021 0224   CALCIUM 9.9 11/13/2021 0224   PROT 7.7 10/23/2021 0038   ALBUMIN 3.1 (L) 11/13/2021 0224   AST 16 11/07/2021 0038   ALT 13 10/17/2021 0038   ALKPHOS 33 (L) 10/19/2021 0038   BILITOT 0.7 11/13/2021 0038  GFRNONAA 5 (L) 11/13/2021 0224  B12 - 526 TSH -ordered Ammonia 26  Imaging I have reviewed the images obtained: MRI examination of the brain: no acute findings, no evidence of PRES  Neurodiagnostics EEG done yesterday with moderate diffuse encephalopathy which is nonspecific.  No seizures or epileptiform discharges.  Assessment:  61 year old with above past medical history presenting for sudden onset of confusion and altered mental status.  On examination he has perseveration and inability to name objects or repeat sentences but he is able to follow some simple commands.  He has asterixis on outstretched arms.  His lab findings reveal increased BUN and creatinine-he is an ESRD patient. I think his current clinical presentation is likely secondary to toxic metabolic encephalopathy from uremia as well as cefepime toxicity-has been on cefepime for a bit since his metatarsal amputation. His neck is supple.  Labs are not extremely concerning for infection.  Low suspicion for CNS infection.  Recommendations: -Continue supportive management including dialysis today -I would  obtain repeat EEG to look for any concerning patterns for cefepime toxicity. May consider LTM EEG after hemodialysis. -Consider discontinuing cefepime but I was told that today is the last dose. -Check TSH (B12 and ammonia normal)  Discussed with Dr. Sloan Leiter  -- Amie Portland, MD Neurologist [facility] Pager: [phone]

## 2021-11-13 NOTE — Progress Notes (Signed)
I was present at this session.  I have reviewed the session itself and made appropriate changes.  Cindee Salt 5/31/20234:21 PM

## 2021-11-13 NOTE — Discharge Summary (Addendum)
Clarification; note type is a progress note and not a discharge summary note. Arterial pressure -250/venous pressure 350. A/V pressures improved after cartridge and dialyzer changed.

## 2021-11-13 NOTE — Progress Notes (Signed)
Dr Candiss Norse at bedside. This rn updated md, pt with varying sbp and a/v pressures during tx, current sbp 94, current arterial pressure -250 and venous pressure 390. Md comments pt with soft sbps at baseline, no bp parameters or new orders needed at this time. Pt conts to sleep with restless movements

## 2021-11-13 NOTE — Progress Notes (Signed)
This rn called and informed dr Candiss Norse turned UF off d/t pt sbp 78. Per md decrease machine temp from 37.0 to 36.0 per pt tolerance, give albumin 25 g for sbps <90. Ok to resume UF if sbp > 100 per md

## 2021-11-14 ENCOUNTER — Inpatient Hospital Stay (HOSPITAL_COMMUNITY): Payer: Medicaid Other

## 2021-11-14 DIAGNOSIS — D638 Anemia in other chronic diseases classified elsewhere: Secondary | ICD-10-CM | POA: Diagnosis not present

## 2021-11-14 DIAGNOSIS — G9340 Encephalopathy, unspecified: Secondary | ICD-10-CM | POA: Diagnosis not present

## 2021-11-14 DIAGNOSIS — I10 Essential (primary) hypertension: Secondary | ICD-10-CM | POA: Diagnosis not present

## 2021-11-14 DIAGNOSIS — N186 End stage renal disease: Secondary | ICD-10-CM | POA: Diagnosis not present

## 2021-11-14 LAB — CBC
HCT: 36 % — ABNORMAL LOW (ref 39.0–52.0)
Hemoglobin: 11.8 g/dL — ABNORMAL LOW (ref 13.0–17.0)
MCH: 30.3 pg (ref 26.0–34.0)
MCHC: 32.8 g/dL (ref 30.0–36.0)
MCV: 92.5 fL (ref 80.0–100.0)
Platelets: 231 10*3/uL (ref 150–400)
RBC: 3.89 MIL/uL — ABNORMAL LOW (ref 4.22–5.81)
RDW: 14 % (ref 11.5–15.5)
WBC: 15.8 10*3/uL — ABNORMAL HIGH (ref 4.0–10.5)
nRBC: 0 % (ref 0.0–0.2)

## 2021-11-14 LAB — COMPREHENSIVE METABOLIC PANEL
ALT: 16 U/L (ref 0–44)
AST: 29 U/L (ref 15–41)
Albumin: 3.5 g/dL (ref 3.5–5.0)
Alkaline Phosphatase: 35 U/L — ABNORMAL LOW (ref 38–126)
Anion gap: 18 — ABNORMAL HIGH (ref 5–15)
BUN: 29 mg/dL — ABNORMAL HIGH (ref 8–23)
CO2: 25 mmol/L (ref 22–32)
Calcium: 9.7 mg/dL (ref 8.9–10.3)
Chloride: 97 mmol/L — ABNORMAL LOW (ref 98–111)
Creatinine, Ser: 6.68 mg/dL — ABNORMAL HIGH (ref 0.61–1.24)
GFR, Estimated: 9 mL/min — ABNORMAL LOW (ref >60)
Glucose, Bld: 82 mg/dL (ref 70–99)
Potassium: 3.5 mmol/L (ref 3.5–5.1)
Sodium: 140 mmol/L (ref 135–145)
Total Bilirubin: 1.4 mg/dL — ABNORMAL HIGH (ref 0.3–1.2)
Total Protein: 8.3 g/dL — ABNORMAL HIGH (ref 6.5–8.1)

## 2021-11-14 LAB — GLUCOSE, CAPILLARY
Glucose-Capillary: 81 mg/dL (ref 70–99)
Glucose-Capillary: 93 mg/dL (ref 70–99)
Glucose-Capillary: 108 mg/dL — ABNORMAL HIGH (ref 70–99)
Glucose-Capillary: 130 mg/dL — ABNORMAL HIGH (ref 70–99)

## 2021-11-14 LAB — LIPASE, BLOOD: Lipase: 82 U/L — ABNORMAL HIGH (ref 11–51)

## 2021-11-14 IMAGING — DX DG CHEST 1V
1 series · 1 of 1 positions shown · non-contrast
Comparison: None Available.

CLINICAL DATA: Leukocytosis and vomiting.

EXAM:
CHEST  1 VIEW

[chest]
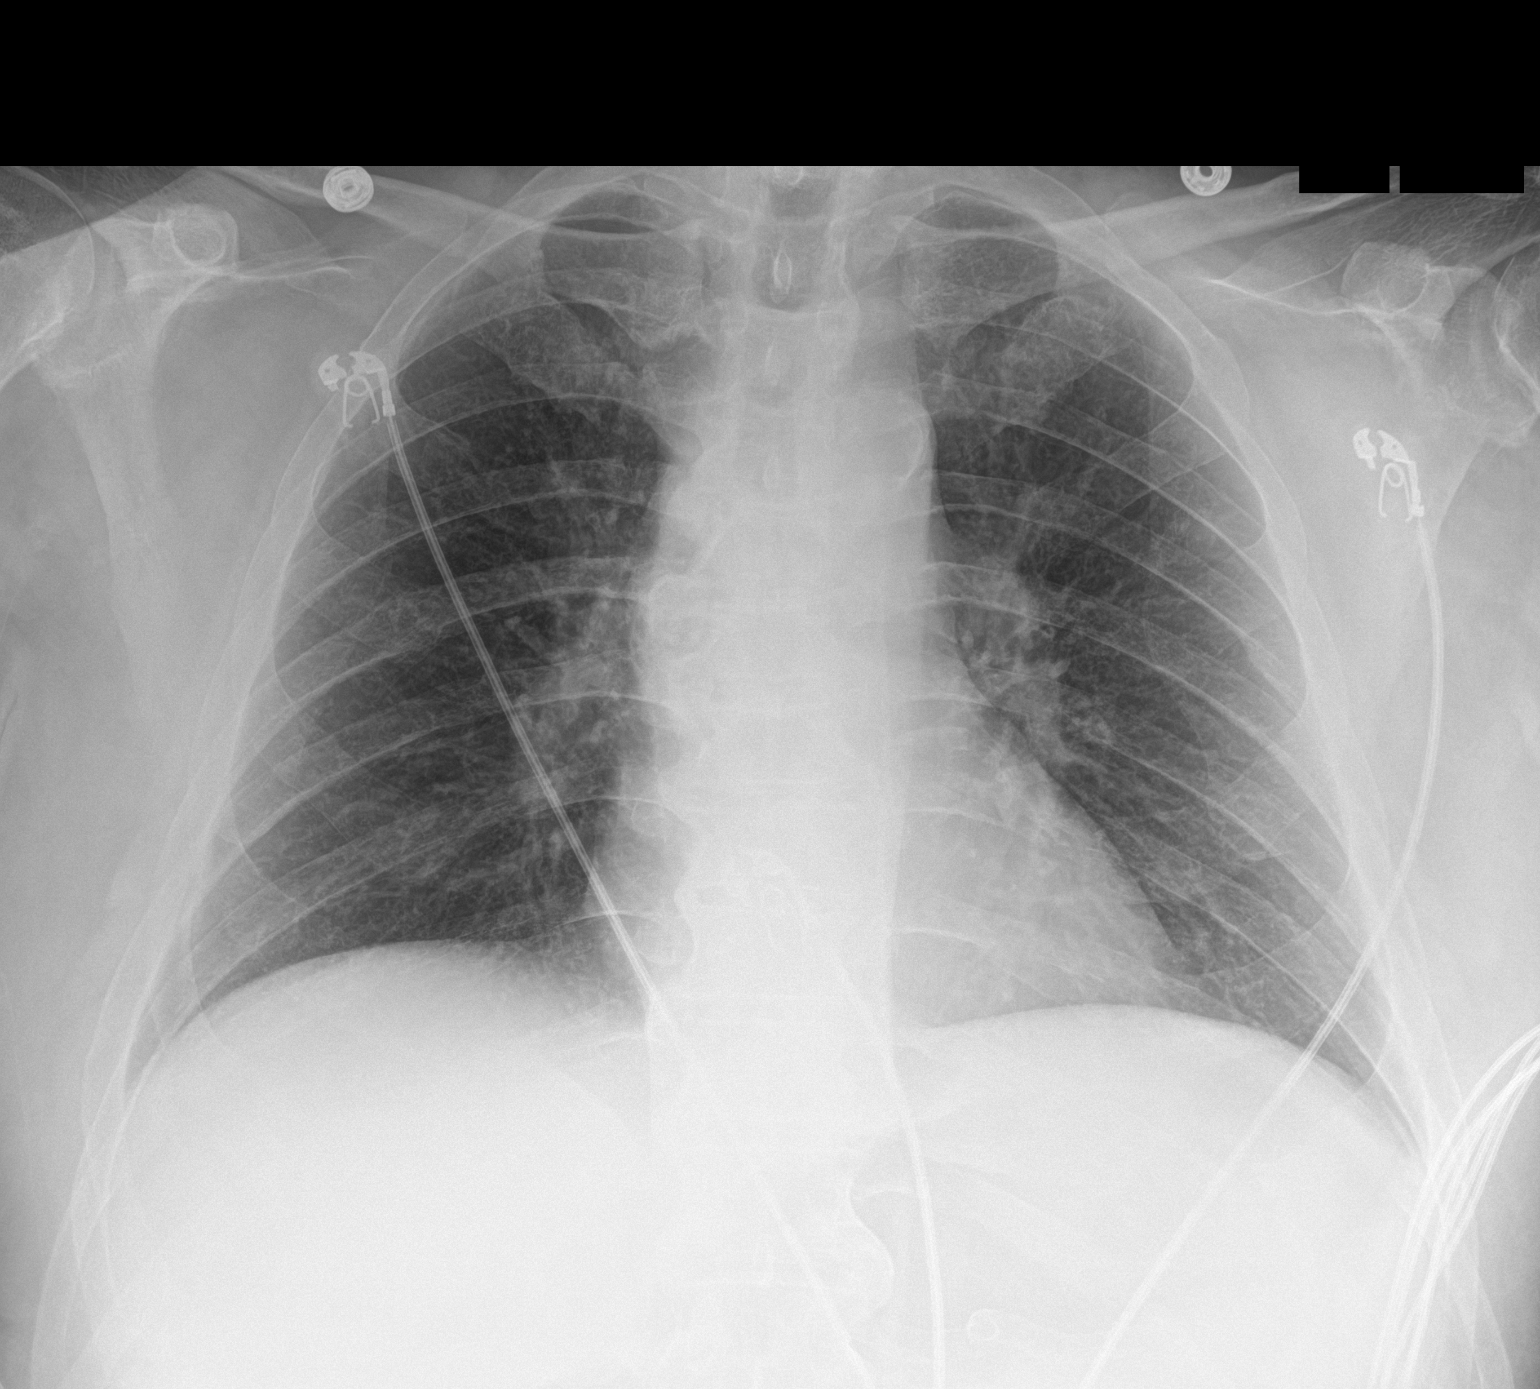

[1 of 1 positions shown; findings below may reference images not displayed]

FINDINGS: The heart size and mediastinal contours are within normal limits.
Both lungs are clear. Multilevel degenerative changes are seen
throughout the thoracic spine.
IMPRESSION: No active cardiopulmonary disease.

## 2021-11-14 MED ORDER — AMLODIPINE BESYLATE 5 MG PO TABS
5.0000 mg | ORAL_TABLET | Freq: Every day | ORAL | Status: DC
  Administered 2021-11-14: 5 mg via ORAL
  Filled 2021-11-14: qty 1

## 2021-11-14 MED ORDER — MELATONIN 3 MG PO TABS
3.0000 mg | ORAL_TABLET | Freq: Every day | ORAL | Status: DC
  Administered 2021-11-14: 3 mg via ORAL
  Filled 2021-11-14: qty 1

## 2021-11-14 MED ORDER — PROCHLORPERAZINE EDISYLATE 10 MG/2ML IJ SOLN
5.0000 mg | Freq: Four times a day (QID) | INTRAMUSCULAR | Status: DC | PRN
  Administered 2021-11-14: 5 mg via INTRAVENOUS
  Filled 2021-11-14: qty 2

## 2021-11-14 MED ORDER — QUETIAPINE FUMARATE 50 MG PO TABS
75.0000 mg | ORAL_TABLET | Freq: Every day | ORAL | Status: DC
  Administered 2021-11-14: 75 mg via ORAL
  Filled 2021-11-14: qty 1

## 2021-11-14 MED ORDER — CLOPIDOGREL BISULFATE 75 MG PO TABS
75.0000 mg | ORAL_TABLET | Freq: Every day | ORAL | Status: DC
  Administered 2021-11-14: 75 mg via ORAL
  Filled 2021-11-14: qty 1

## 2021-11-14 NOTE — Progress Notes (Signed)
PROGRESS NOTE        PATIENT DETAILS Name: Henry Phillips Age: 61 y.o. Sex: male Date of Birth: 1961/03/27 Admit Date: 11/02/2021 Admitting Physician Evalee Mutton Kristeen Mans, MD PCP:Pcp, No  Brief Summary: Patient is a 61 y.o.  male with recent history of right foot osteomyelitis-s/p TMA on 4/20 at [facility] IV Vanco/cefepime/Flagyl until 5/31-ESRD on HD TTS-admitted for evaluation of confusion and malignant hypertension.  Significant events: 5/30>> transfer from [place] for evaluation of confusion/uncontrolled hypertension.  Transferred on IV Cardene.  Significant studies: 5/29>> CT head (done at [facility]): No acute abnormalities. 5/29>> x-ray right/left foot (done at [facility]): No obvious osteomyelitis 5/30>> MRI brain: No acute intracranial abnormality 5/30>> Spot EEG: Negative for seizures. 5/31>> B12: 526 5/31>> ammonia: Within normal limits 5/31>> TSH: Within normal limits 5/31>> vitamin B1: Pending  Significant microbiology data: None  Procedures: None  Consults: Nephrology  Subjective: Seems to be less confused than yesterday-able to tell me his sister's name.  Less perseverance-seems to be speaking a bit more today.  Objective: Vitals: Blood pressure (!) 104/58, pulse 95, temperature 98 F (36.7 C), temperature source Oral, resp. rate 19, weight 77 kg, SpO2 100 %.   Exam: Gen Exam: Confused but not in any distress. HEENT:atraumatic, normocephalic Chest: B/L clear to auscultation anteriorly CVS:S1S2 regular Abdomen:soft non tender, non distended Extremities:no edema Neurology: Non focal Skin: no rash   Pertinent Labs/Radiology:    Latest Ref Rng & Units 11/14/2021   12:51 AM 11/13/2021    2:24 AM 10/21/2021   12:38 AM  CBC  WBC 4.0 - 10.5 K/uL 15.8   12.0   12.8    Hemoglobin 13.0 - 17.0 g/dL 11.8   11.1   11.5    Hematocrit 39.0 - 52.0 % 36.0   33.0   33.4    Platelets 150 - 400 K/uL 231   198   205       Lab Results  Component Value Date   NA 140 11/14/2021   K 3.5 11/14/2021   CL 97 (L) 11/14/2021   CO2 25 11/14/2021       Assessment/Plan: Acute metabolic encephalopathy: Initially concern for PRES/hypertensive encephalopathy-however MRI brain negative for PRES-given that he has been on cefepime for a while-suspicion now for cefepime induced nephrotoxicity.  No fever/neck stiffness-doubt infectious etiology at this time-although leukocytosis has worsened somewhat.  Neurology following-LTM EEG planned.  Will await further recommendations.  Hypertensive emergency: Was on nicardipine infusion at [facility] ED-blood pressure now better controlled.  Persistent-he usually has issues with low blood pressure especially on dialysis days.  Continue to watch closely on amlodipine/metoprolol-since blood pressure is on the softer side today-have decrease amlodipine to 5 mg daily.  Leukocytosis: No fever-just completed several weeks of IV antibiotics on 5/31.  Given confusion-he could have aspirated-but he is on room air-no clinical features consistent of PNA-recheck CXR in any event.  Have ordered blood cultures-and UA.  Watch closely.  Osteomyelitis of right foot-s/p right TMA by podiatry at [facility] on 10/03/2021: Amputation site looks benign-Per review of discharge summary-last day for Vanco/cefepime/Flagyl 5/31.  ESRD: Nephrology consulted for HD.  Normocytic anemia: Mild-follow hemoglobin-defer Aranesp/IV iron to nephrology  PAD: Resume Plavix.  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  Recent Labs    11/13/21  1559 11/13/21 2213 11/14/21 0821  GLUCAP 119* 145* 81      Code status:   Code Status: Full Code   DVT Prophylaxis: heparin injection 5,000 Units Start: 10/20/2021 0600 SCDs Start: 10/15/2021 0027   Family Communication: sister [name] [phone] called on 6/01-no response.     Disposition Plan: Status is:  Observation The patient will require care spanning > 2 midnights and should be moved to inpatient because: Persistent encephalopathy-not yet at baseline-not yet stable for discharge.   Planned Discharge Destination:Home health   Diet: Diet Order             Diet renal/carb modified with fluid restriction Diet-HS Snack? Nothing; Fluid restriction: 1200 mL Fluid; Room service appropriate? Yes; Fluid consistency: Thin  Diet effective now                     Antimicrobial agents: Anti-infectives (From admission, onward)    Start     Dose/Rate Route Frequency Ordered Stop   11/13/21 1200  vancomycin (VANCOCIN) IVPB 1000 mg/200 mL premix        1,000 mg 200 mL/hr over 60 Minutes Intravenous Every M-W-F (Hemodialysis) 11/10/2021 2000 11/13/21 1430   10/25/2021 2200  metroNIDAZOLE (FLAGYL) tablet 500 mg        500 mg Oral Every 12 hours 11/05/2021 1021 11/14/21 0959   11/05/2021 1600  ceFEPIme (MAXIPIME) 2 g in sodium chloride 0.9 % 100 mL IVPB        2 g 200 mL/hr over 30 Minutes Intravenous  Once 11/04/2021 0713 10/24/2021 2056   11/13/2021 1200  vancomycin (VANCOCIN) IVPB 1000 mg/200 mL premix  Status:  Discontinued        1,000 mg 200 mL/hr over 60 Minutes Intravenous Every T-Th-Sa (Hemodialysis) 10/16/2021 0713 11/11/2021 1958   11/01/2021 0800  metroNIDAZOLE (FLAGYL) IVPB 500 mg  Status:  Discontinued        500 mg 100 mL/hr over 60 Minutes Intravenous Every 12 hours 10/23/2021 0705 10/16/2021 1021        MEDICATIONS: Scheduled Meds:  amLODipine  5 mg Oral Daily   Chlorhexidine Gluconate Cloth  6 each Topical Q0600   doxercalciferol  3 mcg Intravenous Q T,Th,Sa-HD   heparin  5,000 Units Subcutaneous Q8H   insulin aspart  0-5 Units Subcutaneous QHS   insulin aspart  0-6 Units Subcutaneous TID WC   insulin glargine-yfgn  10 Units Subcutaneous QHS   melatonin  3 mg Oral QHS   metoprolol tartrate  50 mg Oral BID   QUEtiapine  75 mg Oral QHS   Continuous Infusions:   PRN  Meds:.acetaminophen **OR** acetaminophen, haloperidol lactate **OR** haloperidol lactate, hydrALAZINE, labetalol, LORazepam, ondansetron **OR** ondansetron (ZOFRAN) IV   I have personally reviewed following labs and imaging studies  LABORATORY DATA: CBC: Recent Labs  Lab 11/06/2021 0038 11/13/21 0224 11/14/21 0051  WBC 12.8* 12.0* 15.8*  NEUTROABS 9.2*  --   --   HGB 11.5* 11.1* 11.8*  HCT 33.4* 33.0* 36.0*  MCV 91.5 92.2 92.5  PLT 205 198 231     Basic Metabolic Panel: Recent Labs  Lab 10/19/2021 0038 11/13/21 0224 11/14/21 0051  NA 141 141 140  K 4.4 4.0 3.5  CL 102 104 97*  CO2 24 20* 25  GLUCOSE 212* 173* 82  BUN 44* 53* 29*  CREATININE 8.40* 10.18* 6.68*  CALCIUM 9.8 9.9 9.7  MG 2.1  --   --   PHOS  --  4.6  --  GFR: CrCl cannot be calculated (Unknown ideal weight.).  Liver Function Tests: Recent Labs  Lab 10/30/2021 0038 11/13/21 0224 11/14/21 0051  AST 16  --  29  ALT 13  --  16  ALKPHOS 33*  --  35*  BILITOT 0.7  --  1.4*  PROT 7.7  --  8.3*  ALBUMIN 3.3* 3.1* 3.5    No results for input(s): LIPASE, AMYLASE in the last 168 hours. Recent Labs  Lab 11/13/21 0614  AMMONIA 26     Coagulation Profile: No results for input(s): INR, PROTIME in the last 168 hours.  Cardiac Enzymes: No results for input(s): CKTOTAL, CKMB, CKMBINDEX, TROPONINI in the last 168 hours.  BNP (last 3 results) No results for input(s): PROBNP in the last 8760 hours.  Lipid Profile: No results for input(s): CHOL, HDL, LDLCALC, TRIG, CHOLHDL, LDLDIRECT in the last 72 hours.  Thyroid Function Tests: Recent Labs    11/13/21 0614  TSH 1.489    Anemia Panel: Recent Labs    11/13/21 0614  VITAMINB12 526     Urine analysis: No results found for: COLORURINE, APPEARANCEUR, LABSPEC, PHURINE, GLUCOSEU, HGBUR, BILIRUBINUR, KETONESUR, PROTEINUR, UROBILINOGEN, NITRITE, LEUKOCYTESUR  Sepsis Labs: Lactic Acid, Venous No results found for:  LATICACIDVEN  MICROBIOLOGY: No results found for this or any previous visit (from the past 240 hour(s)).  RADIOLOGY STUDIES/RESULTS: MR BRAIN WO CONTRAST  Result Date: 10/26/2021 CLINICAL DATA:  Initial evaluation for mental status change, unknown cause. EXAM: MRI HEAD WITHOUT CONTRAST TECHNIQUE: Multiplanar, multiecho pulse sequences of the brain and surrounding structures were obtained without intravenous contrast. COMPARISON:  None Available. FINDINGS: Brain: Examination moderately degraded by motion artifact. Cerebral volume within normal limits. Mild chronic microvascular ischemic disease noted involving the periventricular and deep white matter both cerebral hemispheres. 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 No evidence for acute or subacute ischemia. Gray-white matter differentiation maintained. No areas of chronic cortical infarction. No visible acute or chronic intracranial blood products. No mass lesion, mass effect or midline shift. No hydrocephalus or extra-axial fluid collection. Pituitary gland suprasellar region within normal limits. Midline structures intact and normally formed. Vascular: Major intracranial vascular flow voids are grossly maintained at the skull base. Skull and upper cervical spine: Craniocervical junction within normal limits. Bone marrow signal intensity normal. No scalp soft tissue abnormality. Sinuses/Orbits: Prior bilateral ocular lens replacement. Scattered mucosal thickening noted throughout the paranasal sinuses. No significant mastoid effusion. Other: None. IMPRESSION: 1. No acute intracranial abnormality. 2. Mild chronic microvascular ischemic disease for age. Electronically Signed   By: Jeannine Boga M.D.   On: 11/03/2021 19:14   EEG adult  Result Date: 10/20/2021 Lora Havens, MD     10/18/2021  2:26 PM Patient Name: Audrey Eller MRN: [id number] Epilepsy Attending: Lora Havens Referring Physician/Provider:  Jonetta Osgood, MD Date: 10/15/2021 Duration: 21.29 mins Patient history: 61 year old male with altered mental status.  EEG to evaluate for seizure. Level of alertness: Awake AEDs during EEG study: None Technical aspects: This EEG study was done with scalp electrodes positioned according to the 10-20 International system of electrode placement. Electrical activity was acquired at a sampling rate of 500Hz  and reviewed with a high frequency filter of 70Hz  and a low frequency filter of 1Hz . EEG data were recorded continuously and digitally stored. Description: No clear posterior dominant rhythm was seen.  EEG showed continuous generalized predominantly 6 to 9 Hz theta-alpha activity admixed with intermittent generalized 2 to 3 Hz  delta slowing which at times appears sharply contoured.  Hyperventilation and photic stimulation were not performed.   ABNORMALITY - Continuous slow, generalized IMPRESSION: This study is suggestive of moderate diffuse encephalopathy, nonspecific etiology. No seizures or epileptiform discharges were seen throughout the recording. Priyanka Barbra Sarks     LOS: 2 days   Oren Binet, MD  [facility]    To contact the attending provider between 7A-7P or the covering provider during after hours 7P-7A, please log into the web site [email or web address] and access using universal [place] password for that web site. If you do not have the password, please call the hospital operator.  11/14/2021, 12:10 PM

## 2021-11-14 NOTE — Progress Notes (Signed)
At 1833 pt sleeping off and on, sats drop down periodically in the 80's  then back to 90-92%, all with good wave form.   Attempted to put O2 on pt and he drew back to hit this nurse, Pt very awake and had no problem telling the nurse to "get the hell away from me".  Attempted to explain the need for the oxygen and its importance, unable to get pt to allow nurse to place O2 on.  At this time sats come back up into the 90's without intervention.   Sitter at bedside.  Dr Sloan Leiter notified of the above.   Will monitor and report to oncoming nurse and advise to call RT if pt sats stay down or any respiratory concerns arise.

## 2021-11-14 NOTE — Progress Notes (Signed)
Per RN-pt has vomited 3-4 times this am-xray without SBO-will check CT Abd/pelvis.Keep NPO in the interim.

## 2021-11-14 NOTE — Progress Notes (Signed)
Pt vomited moderate amount of green emesis given zofran BS 93 Vs bp 144/97 hr 96 o2 100% tele: NSR pt stable sitter at bedside

## 2021-11-14 NOTE — Progress Notes (Signed)
EEG complete - results pending 

## 2021-11-14 NOTE — Progress Notes (Signed)
PT Cancellation Note  Patient Details Name: Henry Phillips MRN: [id number] DOB: 1960/09/04   Cancelled Treatment:    Reason Eval/Treat Not Completed: Patient at procedure or test/unavailable   Henry Phillips, PT Acute Rehabilitation Services  Pager [phone] Office [phone]  Henry Phillips 11/14/2021, 12:11 PM

## 2021-11-14 NOTE — Progress Notes (Signed)
[facility] Progress Note    Assessment/ Plan:   ESRD:  -Outpatient orders: [place], TTS, 3 hours 45 minutes, F1 80, BFR 450/autoflow 1.5, EDW 85kg, 2K, 3Ca.  Meds: Hectorol 3 mcg q. treatment, heparin 2000 units bolus, 1000 units mid run -will maintain HD on TTS schedule. Fortunately it seems that his dialysis has been running safely for now especially with sitter at the bedside to ensure that he is not moving his arm too much   Acute metabolic encephalopathy -MRI without any abnormalities. Per primary. Possible cefepime induced neurotoxicity. Neuro on board  Volume/ hypertension -EDW 85 kg . Uf'ing as tolerated -has been hypotensive with HD, would hold off on adding midodrine for now. Will utilize albumin support with HD  Osteomyelitis, chronic -per primary   Anemia of Chronic Kidney Disease:  -Hemoglobin 11.8, not receiving any iron or ESA's as an outpatient.  Monitor for now.     Secondary Hyperparathyroidism/Hyperphosphatemia: Resume home sevelamer, Sensipar. Hectorol resumed  DM2 -Management per primary service   Vascular access: Left upper extremity aVF    Subjective:   Patient seen and examined on HD. Remains restless and altered-still requiring restraints/sitter. Unable to adequately UF given low BP.    Objective:   BP (!) 104/58   Pulse 95   Temp 98 F (36.7 C) (Oral)   Resp 19   Wt 77 kg   SpO2 100%   Intake/Output Summary (Last 24 hours) at 11/14/2021 1224 Last data filed at 11/14/2021 0815 Gross per 24 hour  Intake 0 ml  Output 1359 ml  Net -1359 ml   Weight change: 0.4 kg  Physical Exam: VQX:[id number] but NAD, laying flat in bed CVS:rrr, s1s2 Resp:cta bl CMK:LKJZ, nt PHX:TAVWPVXY, moves all ext spontaneously, in restraints, not following commands Dialysis access: LUE AVF +b/t  Imaging: MR BRAIN WO CONTRAST  Result Date: 11/11/2021 CLINICAL DATA:  Initial evaluation for mental status change, unknown cause. EXAM: MRI HEAD  WITHOUT CONTRAST TECHNIQUE: Multiplanar, multiecho pulse sequences of the brain and surrounding structures were obtained without intravenous contrast. COMPARISON:  None Available. FINDINGS: Brain: Examination moderately degraded by motion artifact. Cerebral volume within normal limits. Mild chronic microvascular ischemic disease noted involving the periventricular and deep white matter both cerebral hemispheres. 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 No evidence for acute or subacute ischemia. Gray-white matter differentiation maintained. No areas of chronic cortical infarction. No visible acute or chronic intracranial blood products. No mass lesion, mass effect or midline shift. No hydrocephalus or extra-axial fluid collection. Pituitary gland suprasellar region within normal limits. Midline structures intact and normally formed. Vascular: Major intracranial vascular flow voids are grossly maintained at the skull base. Skull and upper cervical spine: Craniocervical junction within normal limits. Bone marrow signal intensity normal. No scalp soft tissue abnormality. Sinuses/Orbits: Prior bilateral ocular lens replacement. Scattered mucosal thickening noted throughout the paranasal sinuses. No significant mastoid effusion. Other: None. IMPRESSION: 1. No acute intracranial abnormality. 2. Mild chronic microvascular ischemic disease for age. Electronically Signed   By: Jeannine Boga M.D.   On: 10/22/2021 19:14   EEG adult  Result Date: 10/28/2021 Lora Havens, MD     11/11/2021  2:26 PM Patient Name: Athanasios Heldman MRN: [id number] Epilepsy Attending: Lora Havens Referring Physician/Provider: Jonetta Osgood, MD Date: 11/06/2021 Duration: 21.29 mins Patient history: 61 year old male with altered mental status.  EEG to evaluate for seizure. Level of alertness: Awake AEDs during EEG study: None Technical  aspects: This EEG study was done with scalp electrodes  positioned according to the 10-20 International system of electrode placement. Electrical activity was acquired at a sampling rate of 500Hz  and reviewed with a high frequency filter of 70Hz  and a low frequency filter of 1Hz . EEG data were recorded continuously and digitally stored. Description: No clear posterior dominant rhythm was seen.  EEG showed continuous generalized predominantly 6 to 9 Hz theta-alpha activity admixed with intermittent generalized 2 to 3 Hz delta slowing which at times appears sharply contoured.  Hyperventilation and photic stimulation were not performed.   ABNORMALITY - Continuous slow, generalized IMPRESSION: This study is suggestive of moderate diffuse encephalopathy, nonspecific etiology. No seizures or epileptiform discharges were seen throughout the recording. [place]: BMET Recent Labs  Lab 10/27/2021 0038 11/13/21 0224 11/14/21 0051  NA 141 141 140  K 4.4 4.0 3.5  CL 102 104 97*  CO2 24 20* 25  GLUCOSE 212* 173* 82  BUN 44* 53* 29*  CREATININE 8.40* 10.18* 6.68*  CALCIUM 9.8 9.9 9.7  PHOS  --  4.6  --    CBC Recent Labs  Lab 11/03/2021 0038 11/13/21 0224 11/14/21 0051  WBC 12.8* 12.0* 15.8*  NEUTROABS 9.2*  --   --   HGB 11.5* 11.1* 11.8*  HCT 33.4* 33.0* 36.0*  MCV 91.5 92.2 92.5  PLT 205 198 231    Medications:     amLODipine  5 mg Oral Daily   Chlorhexidine Gluconate Cloth  6 each Topical Q0600   doxercalciferol  3 mcg Intravenous Q T,Th,Sa-HD   heparin  5,000 Units Subcutaneous Q8H   insulin aspart  0-5 Units Subcutaneous QHS   insulin aspart  0-6 Units Subcutaneous TID WC   insulin glargine-yfgn  10 Units Subcutaneous QHS   melatonin  3 mg Oral QHS   metoprolol tartrate  50 mg Oral BID   QUEtiapine  75 mg Oral QHS      Gean Quint, MD [facility] 11/14/2021, 12:24 PM

## 2021-11-14 DEATH — deceased

## 2021-11-15 ENCOUNTER — Inpatient Hospital Stay (HOSPITAL_COMMUNITY): Payer: Medicaid Other

## 2021-11-15 DIAGNOSIS — I10 Essential (primary) hypertension: Secondary | ICD-10-CM | POA: Diagnosis not present

## 2021-11-15 DIAGNOSIS — G9340 Encephalopathy, unspecified: Secondary | ICD-10-CM | POA: Diagnosis not present

## 2021-11-15 DIAGNOSIS — E1122 Type 2 diabetes mellitus with diabetic chronic kidney disease: Secondary | ICD-10-CM

## 2021-11-15 DIAGNOSIS — N186 End stage renal disease: Secondary | ICD-10-CM | POA: Diagnosis not present

## 2021-11-15 DIAGNOSIS — G9341 Metabolic encephalopathy: Secondary | ICD-10-CM

## 2021-11-15 DIAGNOSIS — D638 Anemia in other chronic diseases classified elsewhere: Secondary | ICD-10-CM | POA: Diagnosis not present

## 2021-11-15 DIAGNOSIS — I120 Hypertensive chronic kidney disease with stage 5 chronic kidney disease or end stage renal disease: Secondary | ICD-10-CM

## 2021-11-15 DIAGNOSIS — M86671 Other chronic osteomyelitis, right ankle and foot: Secondary | ICD-10-CM

## 2021-11-15 DIAGNOSIS — Z992 Dependence on renal dialysis: Secondary | ICD-10-CM | POA: Diagnosis not present

## 2021-11-15 DIAGNOSIS — J9600 Acute respiratory failure, unspecified whether with hypoxia or hypercapnia: Secondary | ICD-10-CM

## 2021-11-15 LAB — POCT I-STAT 7, (LYTES, BLD GAS, ICA,H+H)
Acid-Base Excess: 2 mmol/L (ref 0.0–2.0)
Acid-base deficit: 4 mmol/L — ABNORMAL HIGH (ref 0.0–2.0)
Bicarbonate: 30.4 mmol/L — ABNORMAL HIGH (ref 20.0–28.0)
Bicarbonate: 23.3 mmol/L (ref 20.0–28.0)
Calcium, Ion: 1.21 mmol/L (ref 1.15–1.40)
Calcium, Ion: 1.1 mmol/L — ABNORMAL LOW (ref 1.15–1.40)
HCT: 33 % — ABNORMAL LOW (ref 39.0–52.0)
HCT: 32 % — ABNORMAL LOW (ref 39.0–52.0)
Hemoglobin: 11.2 g/dL — ABNORMAL LOW (ref 13.0–17.0)
Hemoglobin: 10.9 g/dL — ABNORMAL LOW (ref 13.0–17.0)
O2 Saturation: 99 %
O2 Saturation: 94 %
Patient temperature: 98.6
Patient temperature: 97.8
Potassium: 5.2 mmol/L — ABNORMAL HIGH (ref 3.5–5.1)
Potassium: 3.5 mmol/L (ref 3.5–5.1)
Sodium: 138 mmol/L (ref 135–145)
Sodium: 139 mmol/L (ref 135–145)
TCO2: 32 mmol/L (ref 22–32)
TCO2: 25 mmol/L (ref 22–32)
pCO2 arterial: 64.9 mmHg — ABNORMAL HIGH (ref 32–48)
pCO2 arterial: 49.2 mmHg — ABNORMAL HIGH (ref 32–48)
pH, Arterial: 7.278 — ABNORMAL LOW (ref 7.35–7.45)
pH, Arterial: 7.281 — ABNORMAL LOW (ref 7.35–7.45)
pO2, Arterial: 148 mmHg — ABNORMAL HIGH (ref 83–108)
pO2, Arterial: 78 mmHg — ABNORMAL LOW (ref 83–108)

## 2021-11-15 LAB — COMPREHENSIVE METABOLIC PANEL
ALT: 16 U/L (ref 0–44)
ALT: 19 U/L (ref 0–44)
AST: 20 U/L (ref 15–41)
AST: 29 U/L (ref 15–41)
Albumin: 3.4 g/dL — ABNORMAL LOW (ref 3.5–5.0)
Albumin: 2.9 g/dL — ABNORMAL LOW (ref 3.5–5.0)
Alkaline Phosphatase: 34 U/L — ABNORMAL LOW (ref 38–126)
Alkaline Phosphatase: 37 U/L — ABNORMAL LOW (ref 38–126)
Anion gap: 26 — ABNORMAL HIGH (ref 5–15)
Anion gap: 22 — ABNORMAL HIGH (ref 5–15)
BUN: 30 mg/dL — ABNORMAL HIGH (ref 8–23)
BUN: 59 mg/dL — ABNORMAL HIGH (ref 8–23)
CO2: 21 mmol/L — ABNORMAL LOW (ref 22–32)
CO2: 22 mmol/L (ref 22–32)
Calcium: 9.7 mg/dL (ref 8.9–10.3)
Calcium: 8.8 mg/dL — ABNORMAL LOW (ref 8.9–10.3)
Chloride: 92 mmol/L — ABNORMAL LOW (ref 98–111)
Chloride: 95 mmol/L — ABNORMAL LOW (ref 98–111)
Creatinine, Ser: 5.87 mg/dL — ABNORMAL HIGH (ref 0.61–1.24)
Creatinine, Ser: 7.42 mg/dL — ABNORMAL HIGH (ref 0.61–1.24)
GFR, Estimated: 10 mL/min — ABNORMAL LOW (ref >60)
GFR, Estimated: 8 mL/min — ABNORMAL LOW (ref >60)
Glucose, Bld: 140 mg/dL — ABNORMAL HIGH (ref 70–99)
Glucose, Bld: 248 mg/dL — ABNORMAL HIGH (ref 70–99)
Potassium: 3.8 mmol/L (ref 3.5–5.1)
Potassium: 3.8 mmol/L (ref 3.5–5.1)
Sodium: 139 mmol/L (ref 135–145)
Sodium: 139 mmol/L (ref 135–145)
Total Bilirubin: 1.9 mg/dL — ABNORMAL HIGH (ref 0.3–1.2)
Total Bilirubin: 0.9 mg/dL (ref 0.3–1.2)
Total Protein: 7.5 g/dL (ref 6.5–8.1)
Total Protein: 6.9 g/dL (ref 6.5–8.1)

## 2021-11-15 LAB — BLOOD GAS, ARTERIAL
Acid-base deficit: 2 mmol/L (ref 0.0–2.0)
Bicarbonate: 20.4 mmol/L (ref 20.0–28.0)
Drawn by: 28099
O2 Saturation: 89.3 %
Patient temperature: 36.8
pCO2 arterial: 28 mmHg — ABNORMAL LOW (ref 32–48)
pH, Arterial: 7.47 — ABNORMAL HIGH (ref 7.35–7.45)
pO2, Arterial: 58 mmHg — ABNORMAL LOW (ref 83–108)

## 2021-11-15 LAB — CBC WITH DIFFERENTIAL/PLATELET
Abs Immature Granulocytes: 0.26 10*3/uL — ABNORMAL HIGH (ref 0.00–0.07)
Basophils Absolute: 0.1 10*3/uL (ref 0.0–0.1)
Basophils Relative: 0 %
Eosinophils Absolute: 0 10*3/uL (ref 0.0–0.5)
Eosinophils Relative: 0 %
HCT: 38 % — ABNORMAL LOW (ref 39.0–52.0)
Hemoglobin: 12.5 g/dL — ABNORMAL LOW (ref 13.0–17.0)
Immature Granulocytes: 1 %
Lymphocytes Relative: 4 %
Lymphs Abs: 1.2 10*3/uL (ref 0.7–4.0)
MCH: 30.6 pg (ref 26.0–34.0)
MCHC: 32.9 g/dL (ref 30.0–36.0)
MCV: 92.9 fL (ref 80.0–100.0)
Monocytes Absolute: 2.3 10*3/uL — ABNORMAL HIGH (ref 0.1–1.0)
Monocytes Relative: 8 %
Neutro Abs: 26.4 10*3/uL — ABNORMAL HIGH (ref 1.7–7.7)
Neutrophils Relative %: 87 %
Platelets: 214 10*3/uL (ref 150–400)
RBC: 4.09 MIL/uL — ABNORMAL LOW (ref 4.22–5.81)
RDW: 14.3 % (ref 11.5–15.5)
WBC: 30.2 10*3/uL — ABNORMAL HIGH (ref 4.0–10.5)
nRBC: 0 % (ref 0.0–0.2)

## 2021-11-15 LAB — MRSA NEXT GEN BY PCR, NASAL: MRSA by PCR Next Gen: NOT DETECTED

## 2021-11-15 LAB — GLUCOSE, CAPILLARY
Glucose-Capillary: 191 mg/dL — ABNORMAL HIGH (ref 70–99)
Glucose-Capillary: 185 mg/dL — ABNORMAL HIGH (ref 70–99)
Glucose-Capillary: 180 mg/dL — ABNORMAL HIGH (ref 70–99)
Glucose-Capillary: 181 mg/dL — ABNORMAL HIGH (ref 70–99)
Glucose-Capillary: 201 mg/dL — ABNORMAL HIGH (ref 70–99)

## 2021-11-15 LAB — SEDIMENTATION RATE: Sed Rate: 70 mm/hr — ABNORMAL HIGH (ref 0–16)

## 2021-11-15 LAB — CBC
HCT: 33.6 % — ABNORMAL LOW (ref 39.0–52.0)
Hemoglobin: 11 g/dL — ABNORMAL LOW (ref 13.0–17.0)
MCH: 30.5 pg (ref 26.0–34.0)
MCHC: 32.7 g/dL (ref 30.0–36.0)
MCV: 93.1 fL (ref 80.0–100.0)
Platelets: 323 10*3/uL (ref 150–400)
RBC: 3.61 MIL/uL — ABNORMAL LOW (ref 4.22–5.81)
RDW: 15 % (ref 11.5–15.5)
WBC: 33.9 10*3/uL — ABNORMAL HIGH (ref 4.0–10.5)
nRBC: 0 % (ref 0.0–0.2)

## 2021-11-15 LAB — VITAMIN B1: Vitamin B1 (Thiamine): 137.1 nmol/L (ref 66.5–200.0)

## 2021-11-15 LAB — MAGNESIUM: Magnesium: 2.1 mg/dL (ref 1.7–2.4)

## 2021-11-15 LAB — VANCOMYCIN, RANDOM: Vancomycin Rm: 23 ug/mL

## 2021-11-15 IMAGING — DX DG CHEST 1V PORT SAME DAY
1 series · 1 of 1 positions shown · non-contrast
Comparison: Chest x-ray from yesterday.

CLINICAL DATA: Shortness of breath.

EXAM:
PORTABLE CHEST 1 VIEW

[chest]
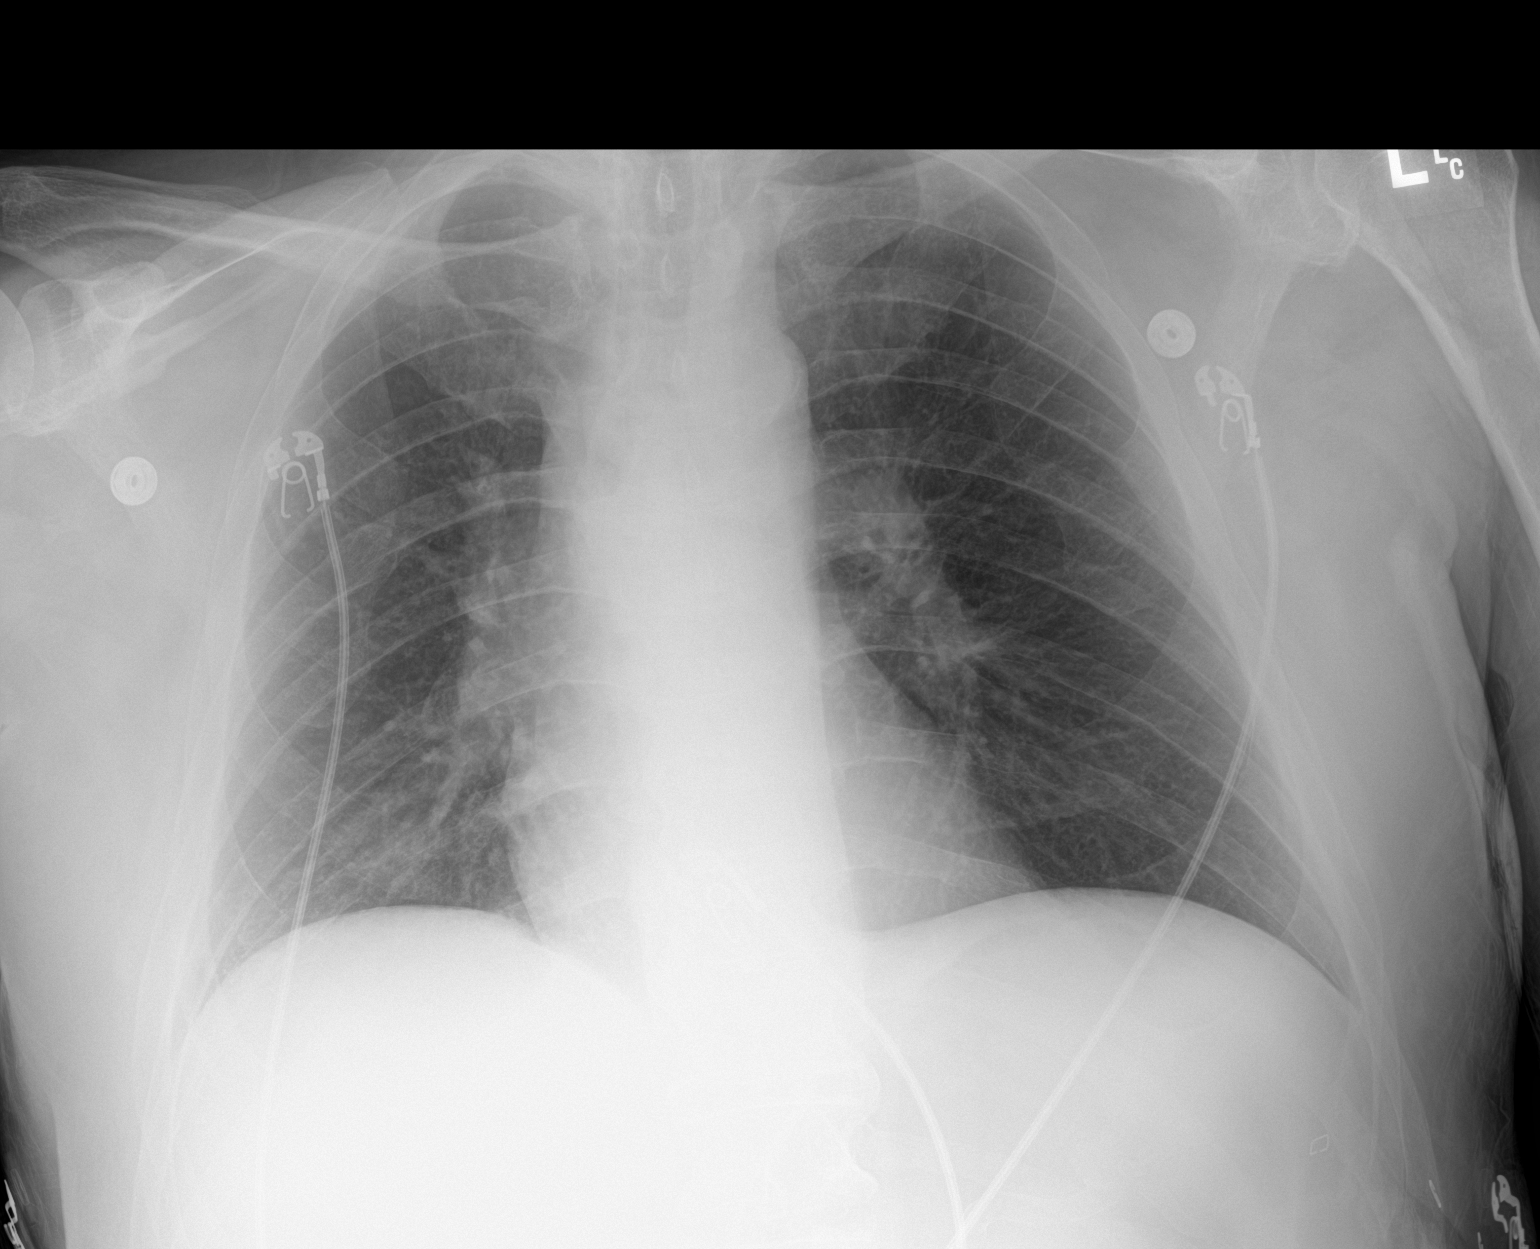

[1 of 1 positions shown; findings below may reference images not displayed]

FINDINGS: The heart size and mediastinal contours are within normal limits.
Both lungs are clear. The visualized skeletal structures are
unremarkable.
IMPRESSION: No active disease.

## 2021-11-15 IMAGING — MR MR FOOT*R* W/O CM
4 of 5 series · 19 of 40 positions shown · non-contrast
Comparison: None Available.

CLINICAL DATA: Foot swelling, nondiabetic, osteomyelitis suspected
leukocytosis-unclear source-recent TMA-r/o osteo/abscess

EXAM:
MRI OF THE RIGHT FOREFOOT WITHOUT CONTRAST
TECHNIQUE: Multiplanar, multisequence MR imaging of the right foot was
performed. No intravenous contrast was administered.

[Series 5: T1 · oblique · 3.0mm · 0.29mm/px · 3 of 30 slices shown (1 of 2)]
[im 4/30]
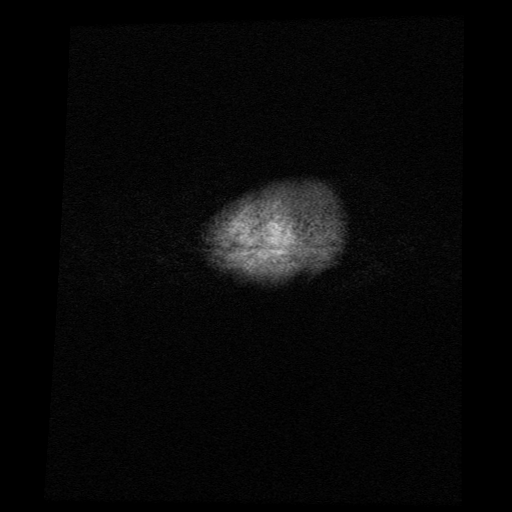
[im 17/30]
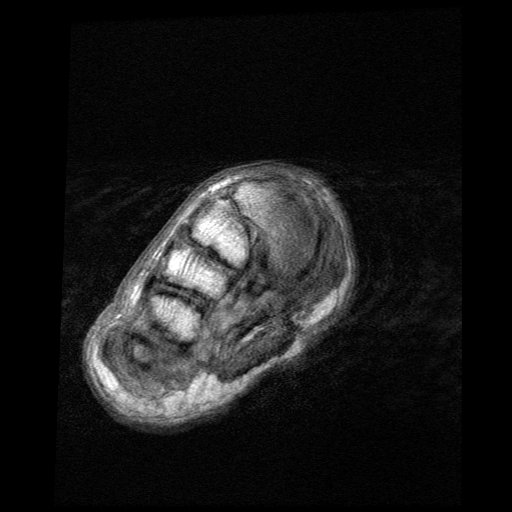
[im 26/30]
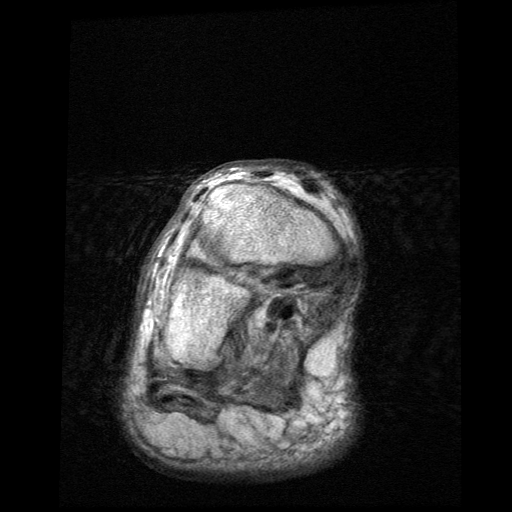

[Series 6: T2 fat-sat · oblique · 3.0mm · 0.29mm/px · 9 of 30 slices shown (1 of 2)]
[im 1/30]
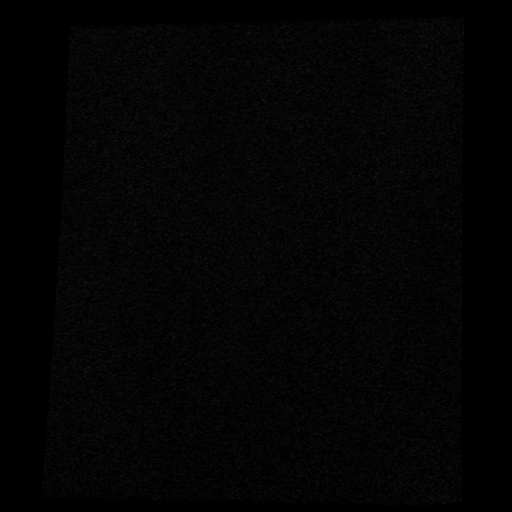
[im 4/30]
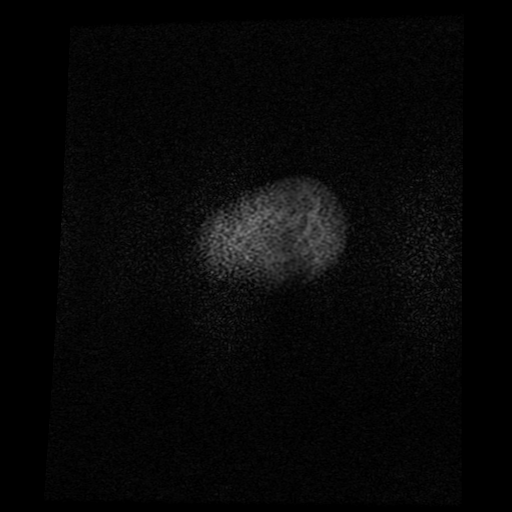
[im 8/30]
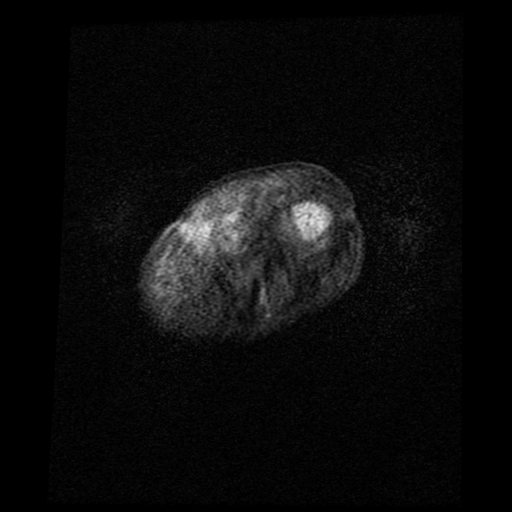
[im 11/30]
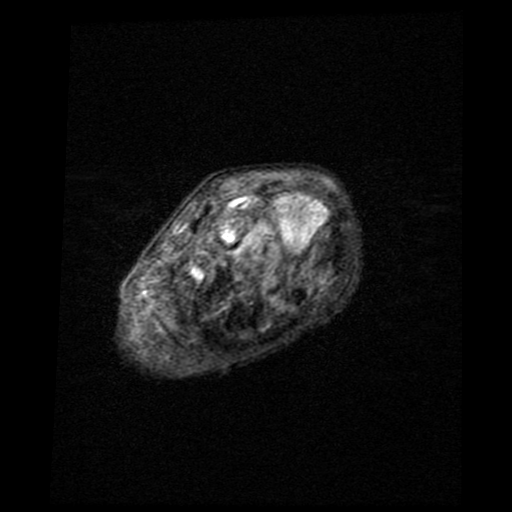
[im 15/30]
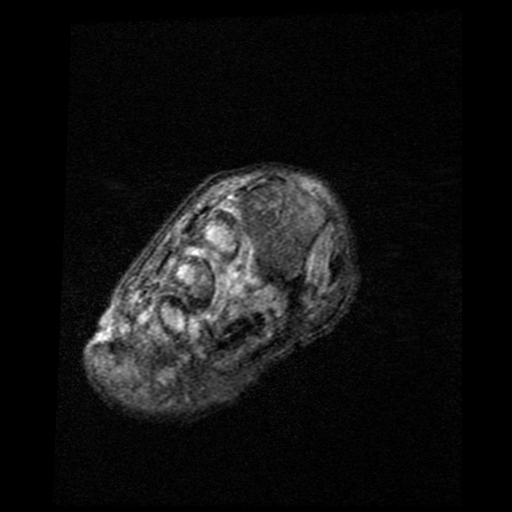
[im 19/30]
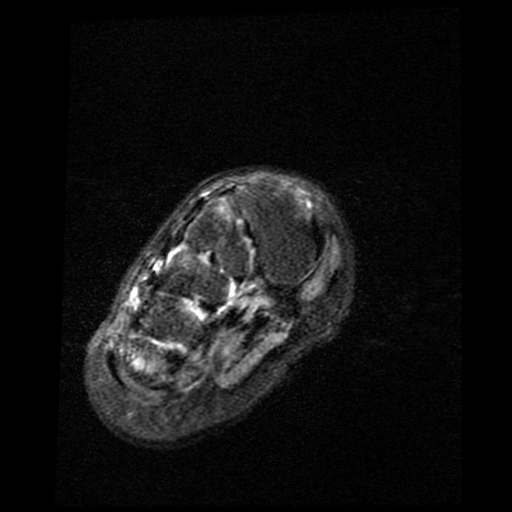
[im 22/30]
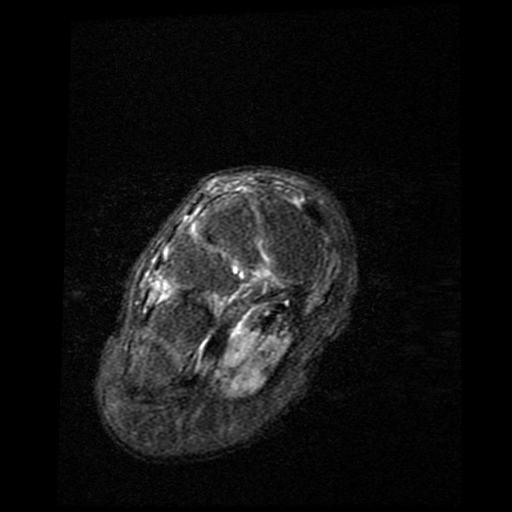
[im 26/30]
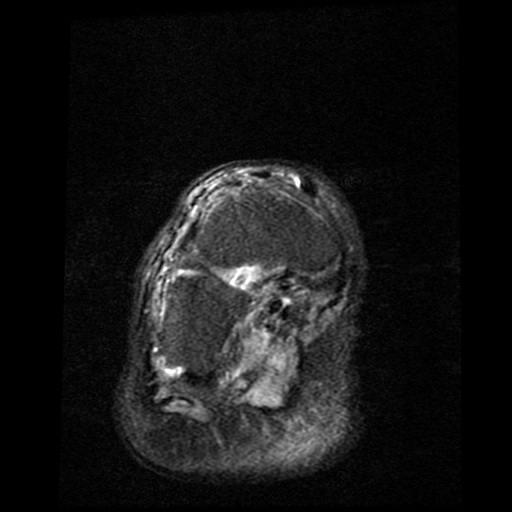
[im 30/30]
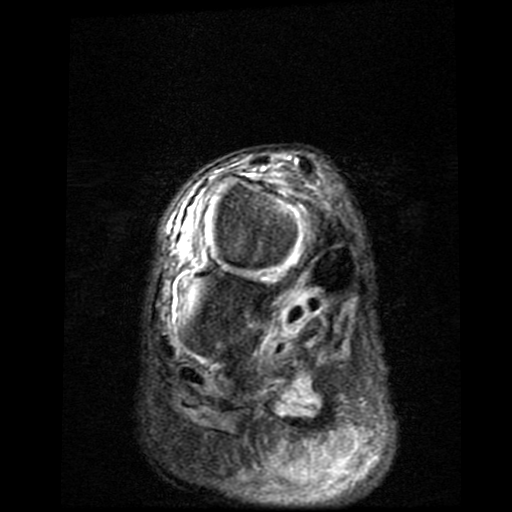

[Series 7: T1 · sagittal · 3.0mm · 0.29mm/px · 3 of 20 slices shown (2 of 2)]
[im 4/20]
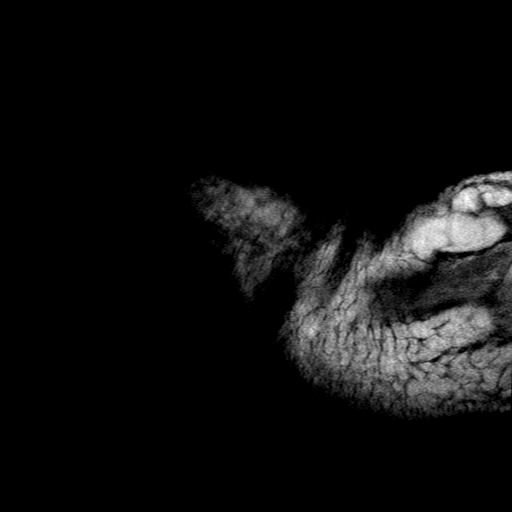
[im 12/20]
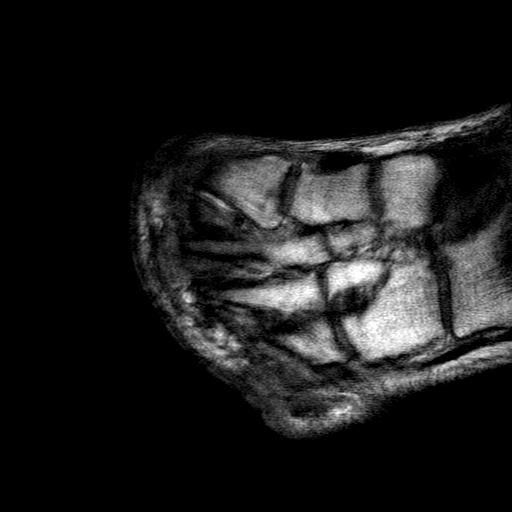
[im 20/20]
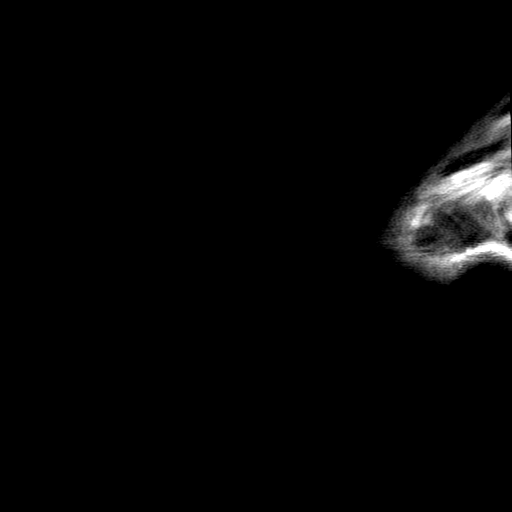

[Series 10: T2 fat-sat · sagittal · 3.0mm · 0.29mm/px · 4 of 20 slices shown (2 of 2)]
[im 1/20]
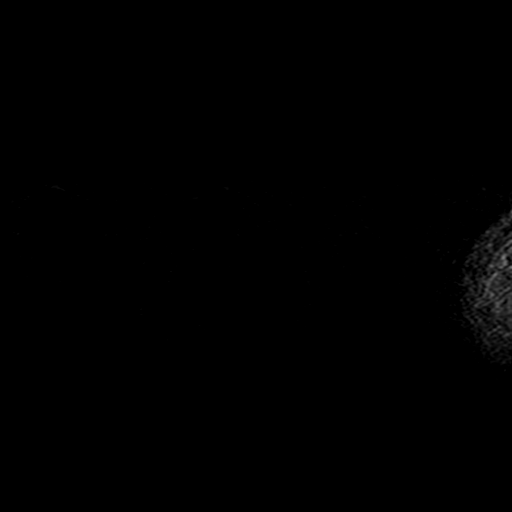
[im 4/20]
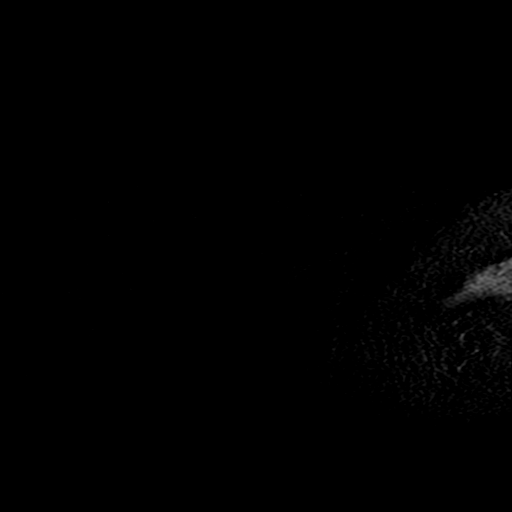
[im 12/20]
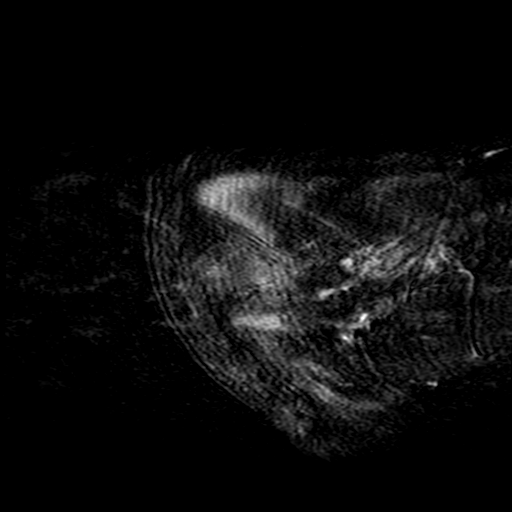
[im 20/20]
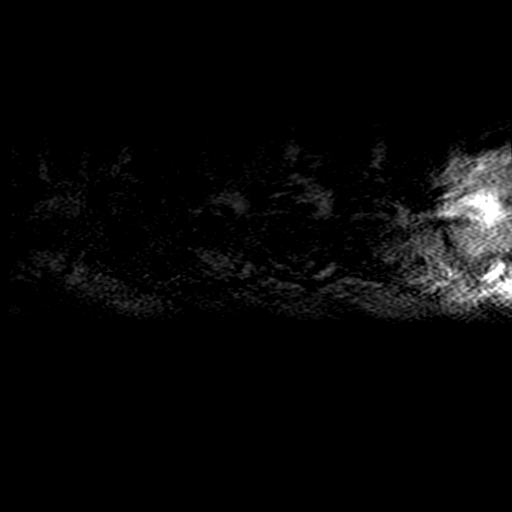

[19 of 40 positions shown; findings below may reference images not displayed]

FINDINGS: Technical Note: Despite efforts by the technologist and patient,
motion artifact is present on today's exam and could not be
eliminated. This reduces exam sensitivity and specificity.

Bones/Joint/Cartilage

Status post transmetatarsal amputation of the first through fifth
rays. Prominent bone marrow edema with intermediate to low T1 marrow
signal within each of the residual metatarsal shafts as well as the
first and second metatarsal bases. Relative sparing of the third
through fifth metatarsal bases. Findings are compatible with acute
osteomyelitis.

No definite findings of acute osteomyelitis within the midfoot on
significantly motion degraded images. No gross malalignment.

Ligaments

Suboptimally assessed by motion.

Muscles and Tendons

Post amputation changes of the musculotendinous structures. No
intramuscular fluid collections are seen.

Soft tissues

Probable wound or ulceration distal to the second metatarsal. No
fluid collections are evident.
IMPRESSION: 1. Motion degraded exam.
2. Prior transmetatarsal amputation of the right forefoot with
findings of acute osteomyelitis involving the residual first through
fifth metatarsals.
3. Probable wound or ulceration distal to the second metatarsal. No
fluid collections evident.

## 2021-11-15 IMAGING — DX DG CHEST 1V PORT
1 series · 2 of 2 positions shown · non-contrast
Comparison: Chest x-ray [DATE]

CLINICAL DATA: Encounter for central line placement.

EXAM:
PORTABLE CHEST 1 VIEW

[Series 1: chest · 0.14mm/px · 2 of 2 slices shown]
[im 1/2]
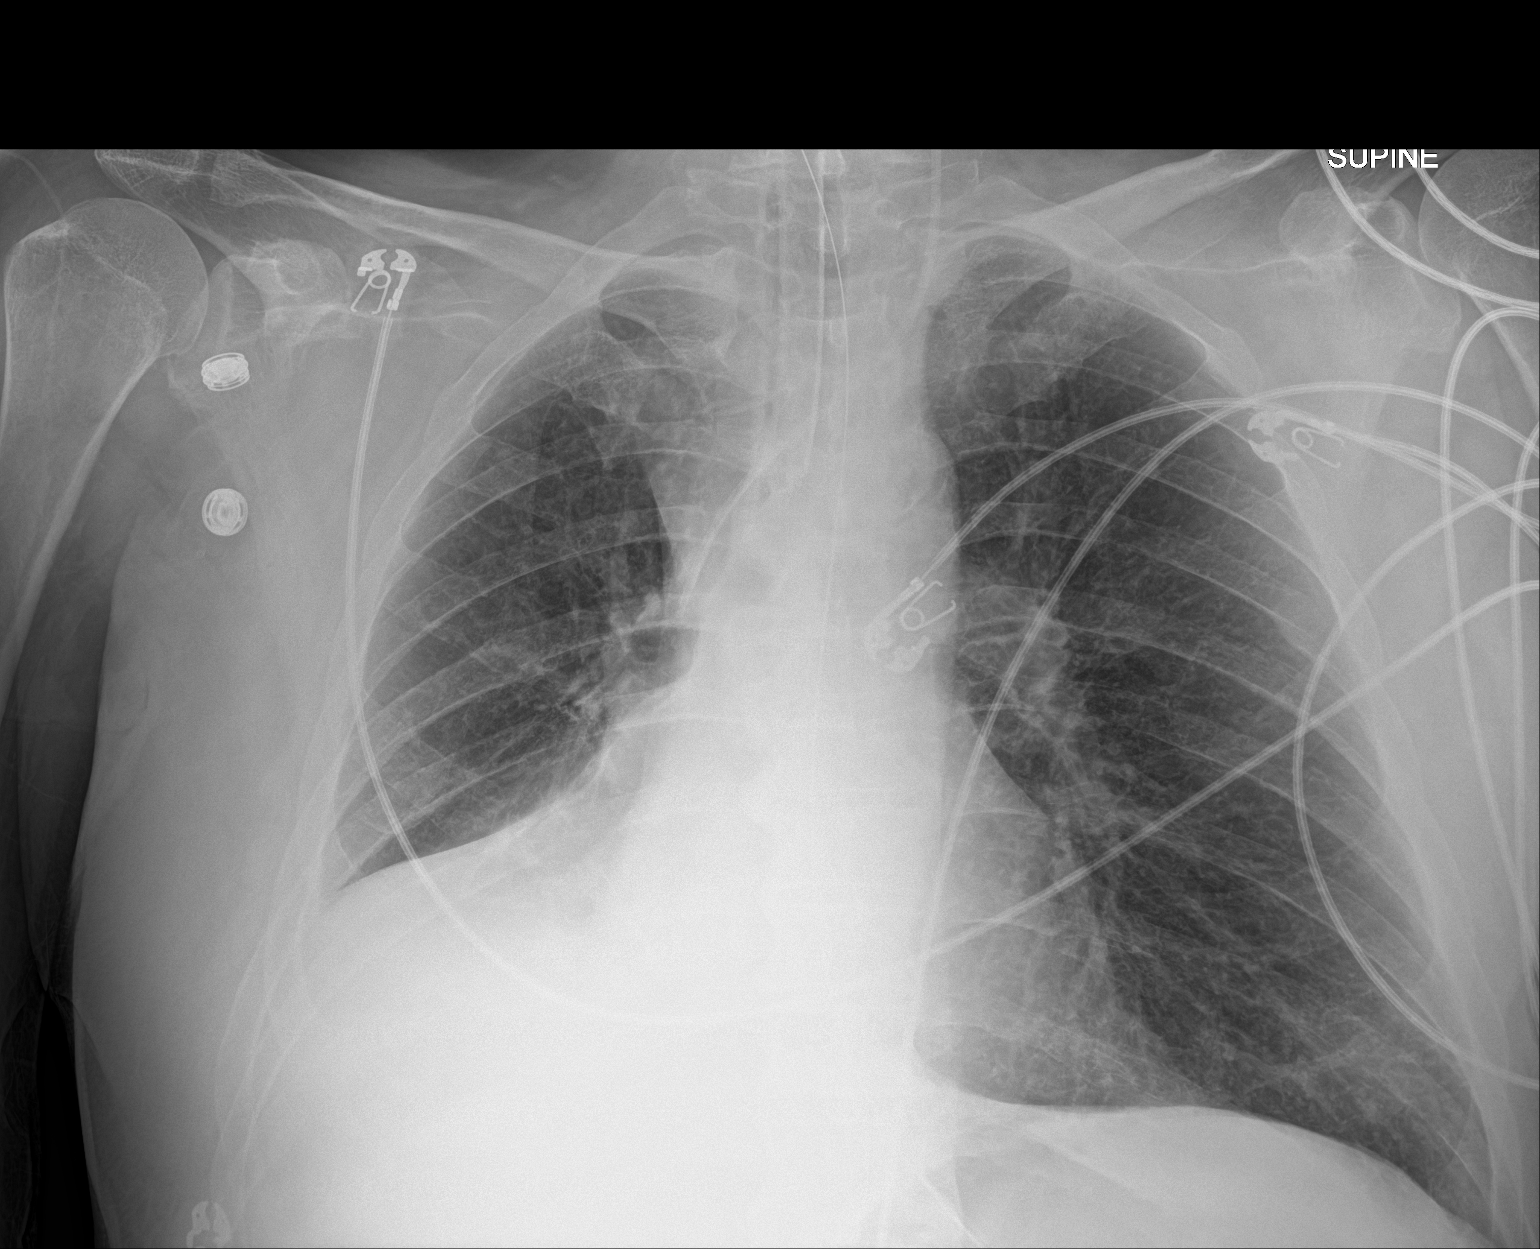
[im 2/2]
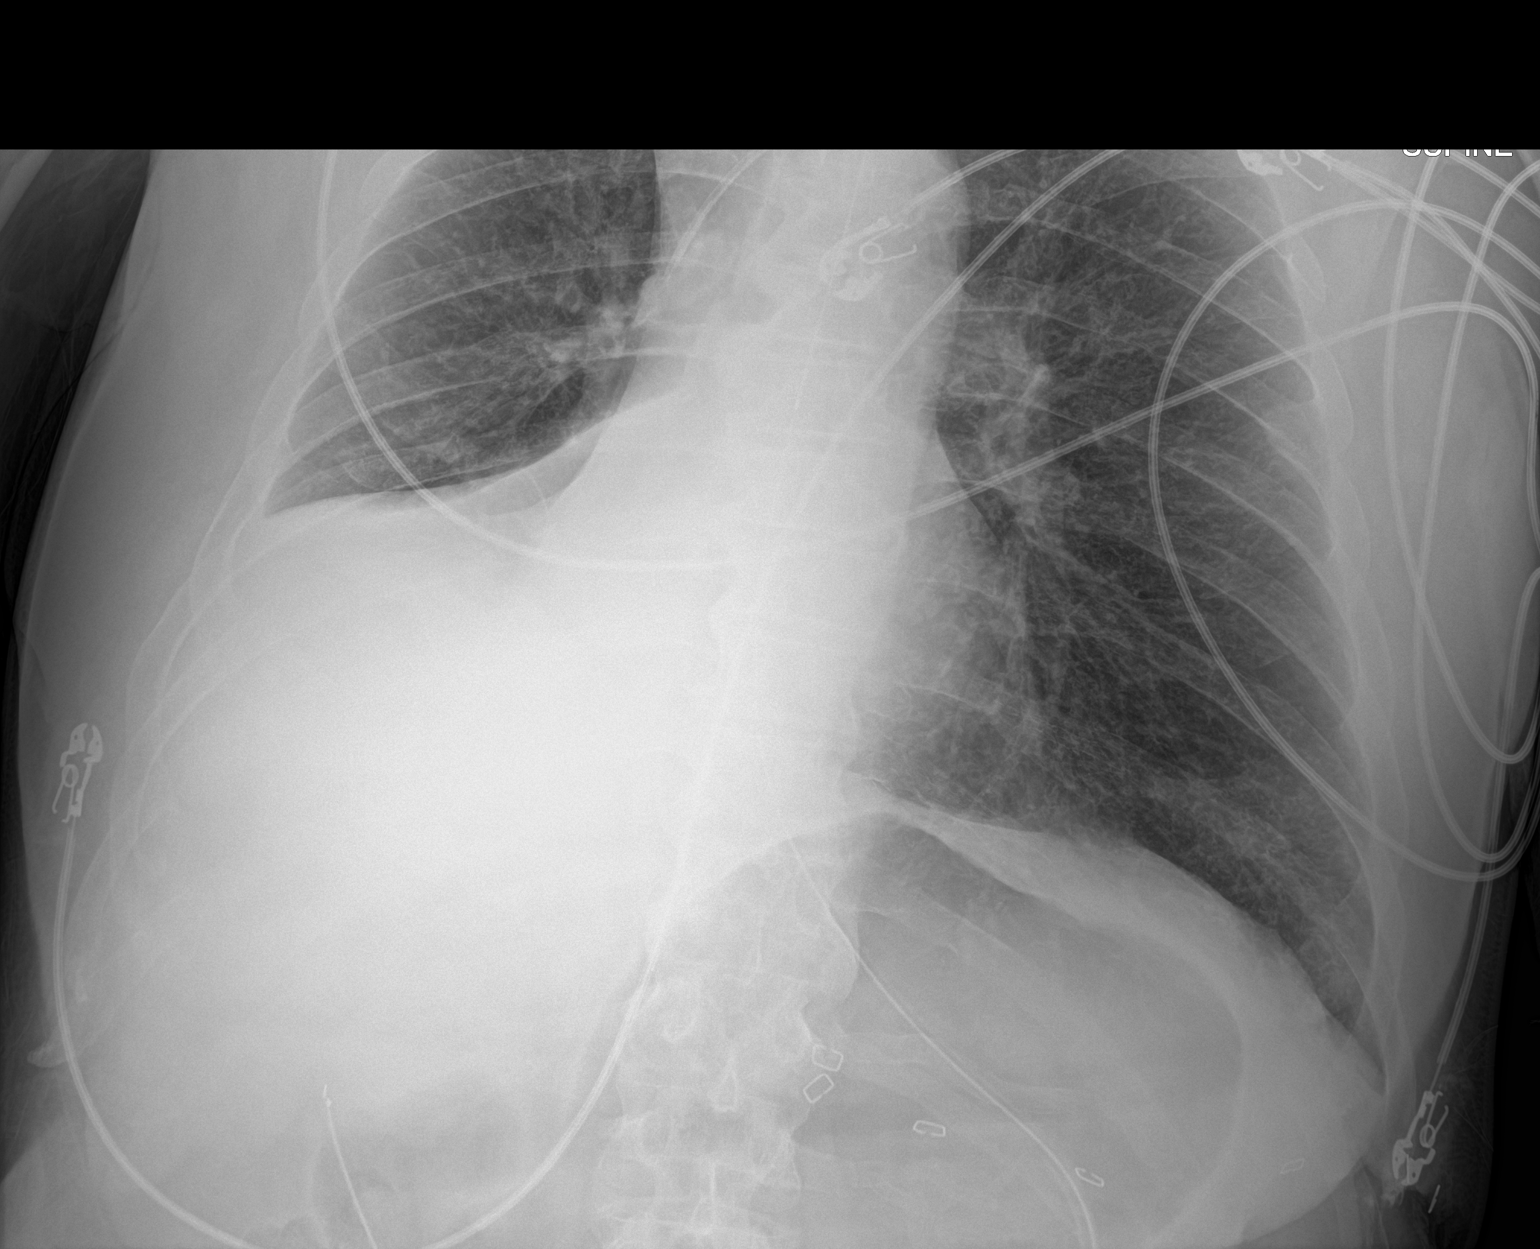

[2 of 2 positions shown; findings below may reference images not displayed]

FINDINGS: Endotracheal tube tip is 1.9 cm above the carina. Enteric tube tip
is in the gastric antrum. Left-sided central venous catheter tip
projects over the SVC.

Right lower lobe collapse persists. Left lung is clear.
Cardiomediastinal silhouette is stable.
IMPRESSION: 1. Left-sided central venous catheter tip projects over the SVC.
2. Stable right lower lobe collapse.

## 2021-11-15 IMAGING — CT CT ANGIO CHEST
2 of 6 series · 18 of 36 positions shown · IV contrast (agent unspecified)
Comparison: None Available.

CLINICAL DATA: Pulmonary embolism (PE) suspected, positive D-dimer
r/o

EXAM:
CT ANGIOGRAPHY CHEST WITH CONTRAST
TECHNIQUE: Multidetector CT imaging of the chest was performed using the
standard protocol during bolus administration of intravenous
contrast. Multiplanar CT image reconstructions and MIPs were
obtained to evaluate the vascular anatomy.

[Series 7: pe thins · axial · 0.93mm/px · z∈[+1274,+1578]mm · 17 of 484 slices shown]
[im 25/484  lung]
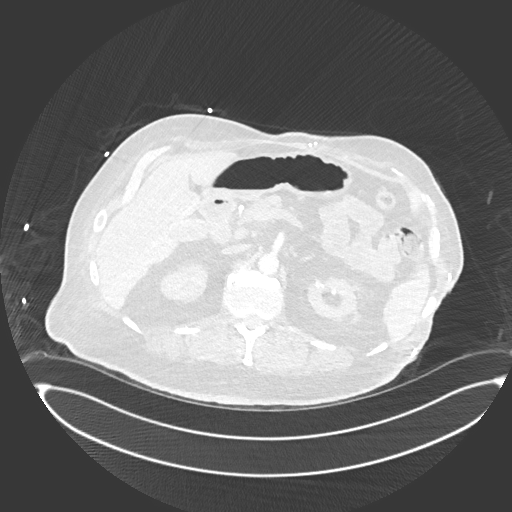
[im 49/484  mediastinal]
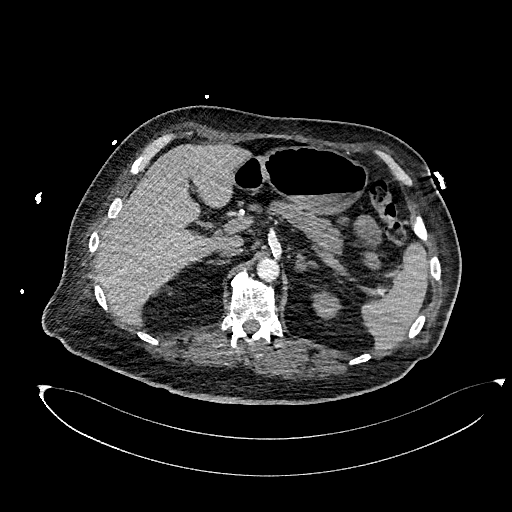
[im 73/484  lung]
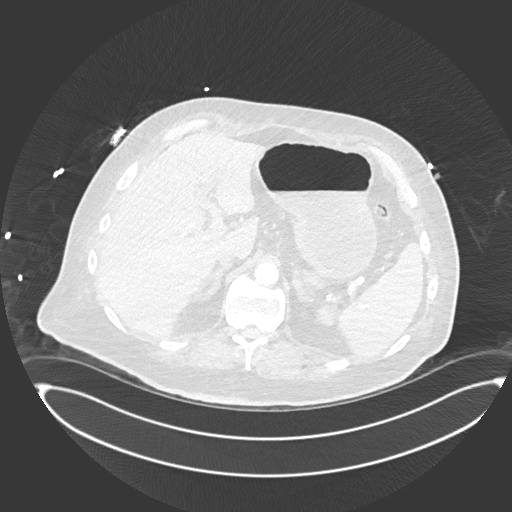
[im 97/484  mediastinal]
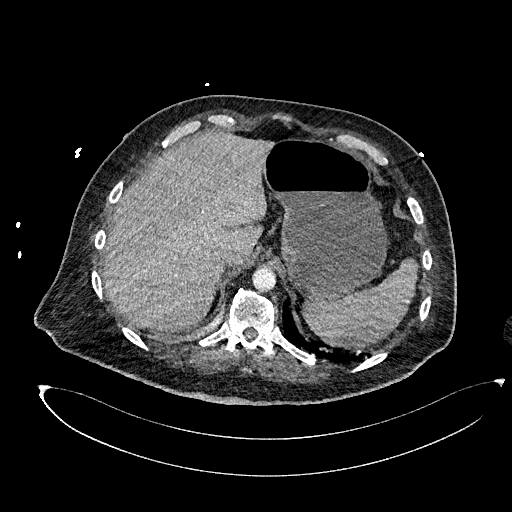
[im 145/484  lung]
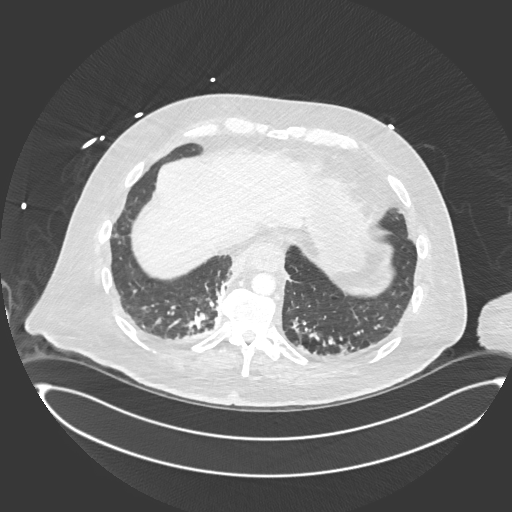
[im 170/484  mediastinal]
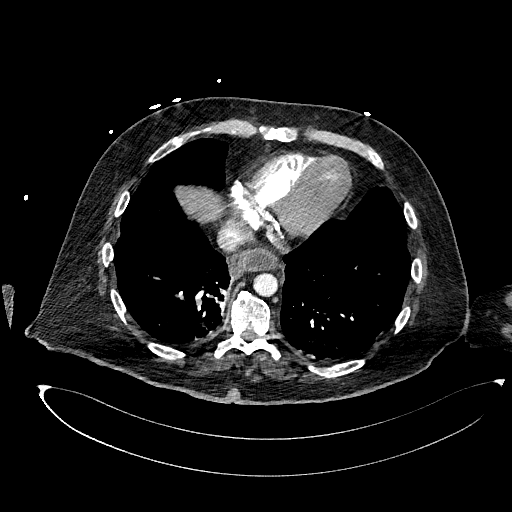
[im 194/484  lung]
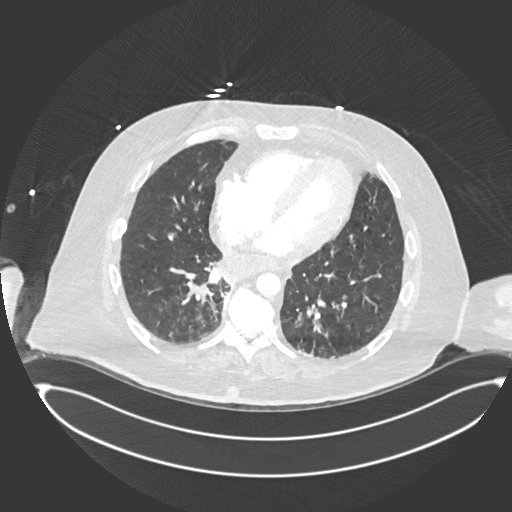
[im 218/484  mediastinal]
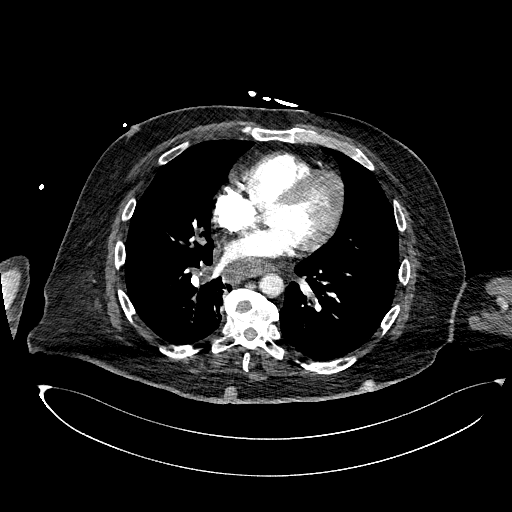
[im 242/484  lung]
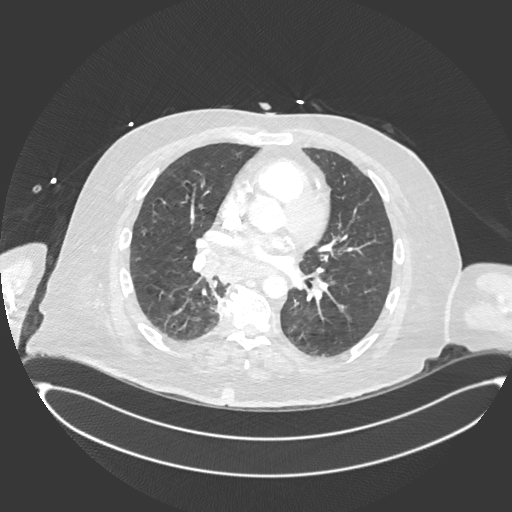
[im 266/484  mediastinal]
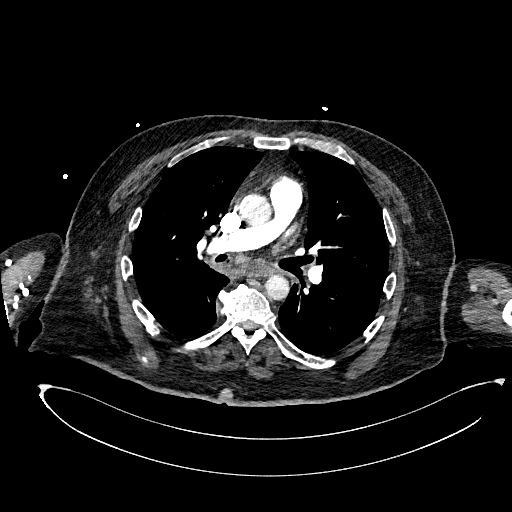
[im 290/484  lung]
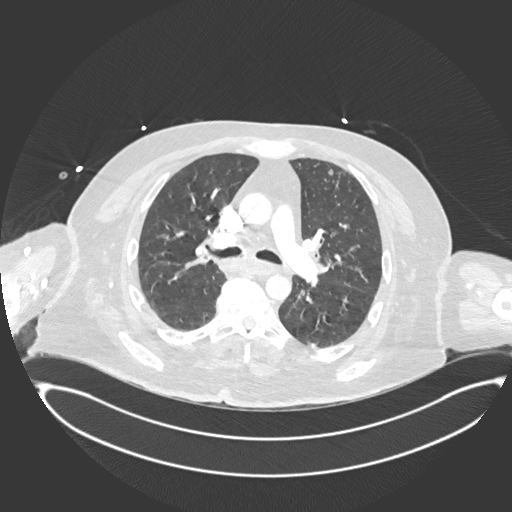
[im 314/484  mediastinal]
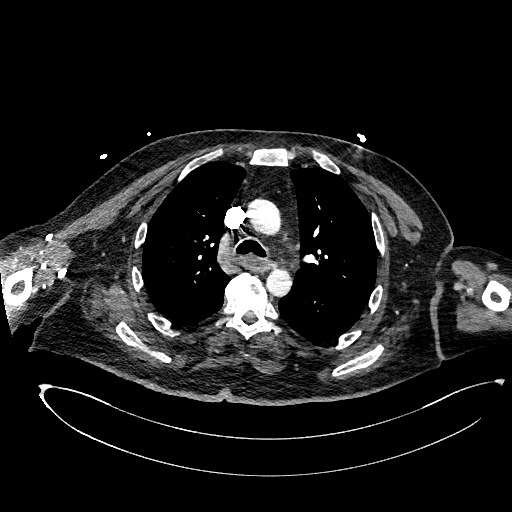
[im 339/484  lung]
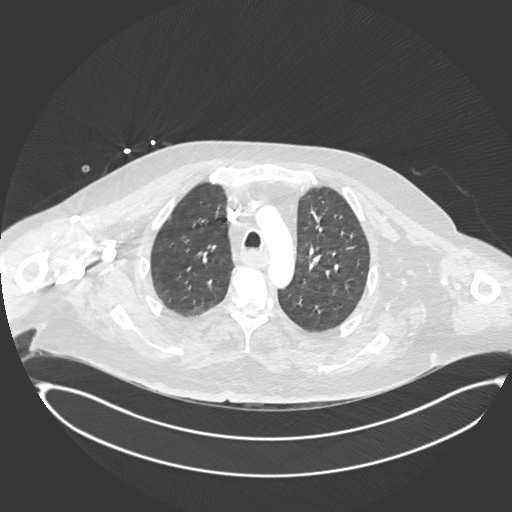
[im 387/484  mediastinal]
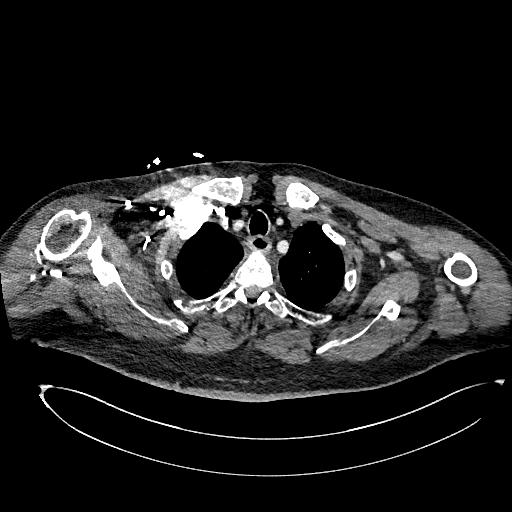
[im 411/484  lung]
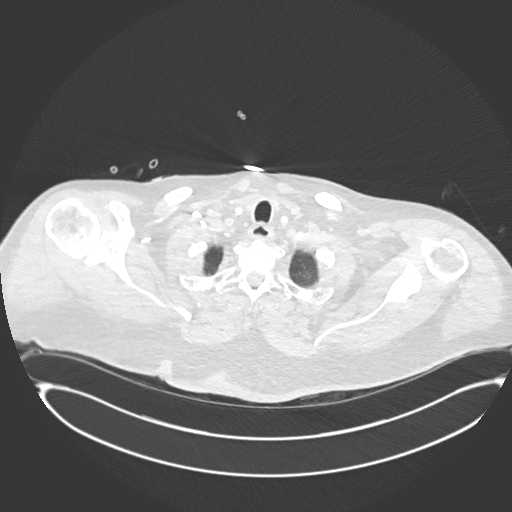
[im 435/484  mediastinal]
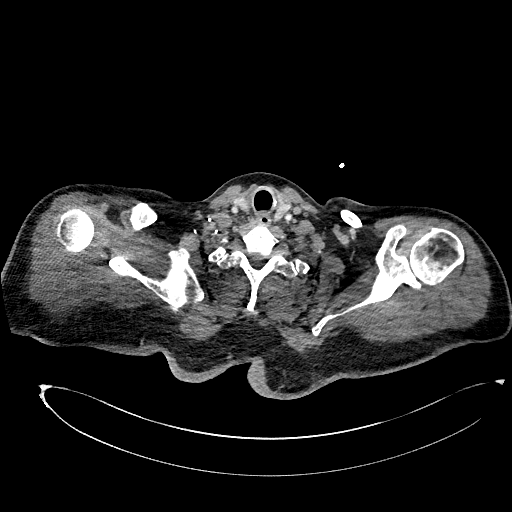
[im 459/484  lung]
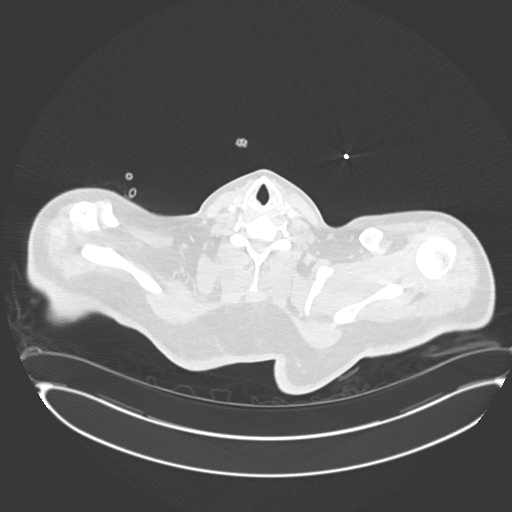

[Series 8: pe 2mm cor · coronal · 0.69mm/px · 1 of 151 slices shown]
[im 76/151  mediastinal]
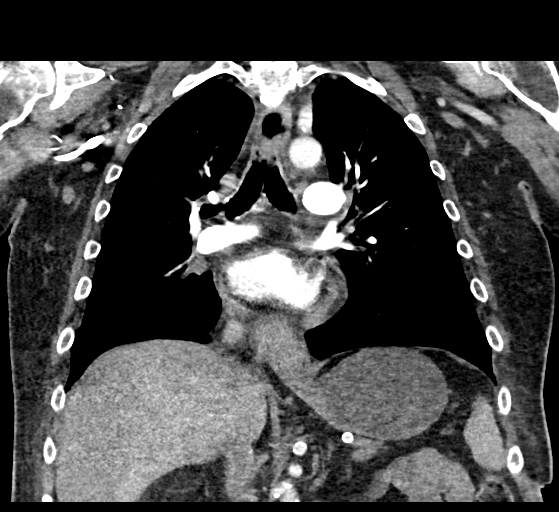

[18 of 36 positions shown; findings below may reference images not displayed]

RADIATION DOSE REDUCTION: This exam was performed according to the
departmental dose-optimization program which includes automated
exposure control, adjustment of the mA and/or kV according to
patient size and/or use of iterative reconstruction technique.

CONTRAST:  70mL OMNIPAQUE IOHEXOL 350 MG/ML SOLN
FINDINGS: Cardiovascular: Heart size normal. No pericardial effusion. The RV
is nondilated. Satisfactory opacification of pulmonary arteries
noted, and there is no evidence of pulmonary emboli. Extensive
coronary calcifications. Calcified plaque in the aortic arch.
Adequate contrast opacification of the thoracic aorta with no
evidence of dissection, aneurysm, or stenosis. There is classic
3-vessel brachiocephalic arch anatomy without proximal stenosis.

Mediastinum/Nodes: Patulous thick-walled esophagus. Subcentimeter
subcarinal lymph nodes. No hilar adenopathy.

Lungs/Pleura: Fluid/debris occludes several left lower lobe
segmental bronchi. No pleural effusion. No pneumothorax. Minimal
subsegmental atelectasis posteriorly in the lung bases right worse
than left. Breathing motion degrades some images.

Upper Abdomen: No acute findings.

Musculoskeletal: Anterior vertebral endplate spurring at multiple
levels in the mid and lower thoracic spine.

Review of the MIP images confirms the above findings.
IMPRESSION: 1. Negative for acute PE or thoracic aortic dissection.
2. Fluid/debris occluding several right lower lobe segmental
bronchi.
3. Coronary and Aortic Atherosclerosis ([RN]-170.0).
4. Patulous thick-walled thoracic esophagus.

## 2021-11-15 IMAGING — DX DG CHEST 1V PORT
1 series · 2 of 2 positions shown · non-contrast
Comparison: Chest x-ray [DATE]

CLINICAL DATA: Intubation.

EXAM:
PORTABLE CHEST 1 VIEW

[Series 1: chest · 0.14mm/px · 2 of 2 slices shown]
[im 1/2]
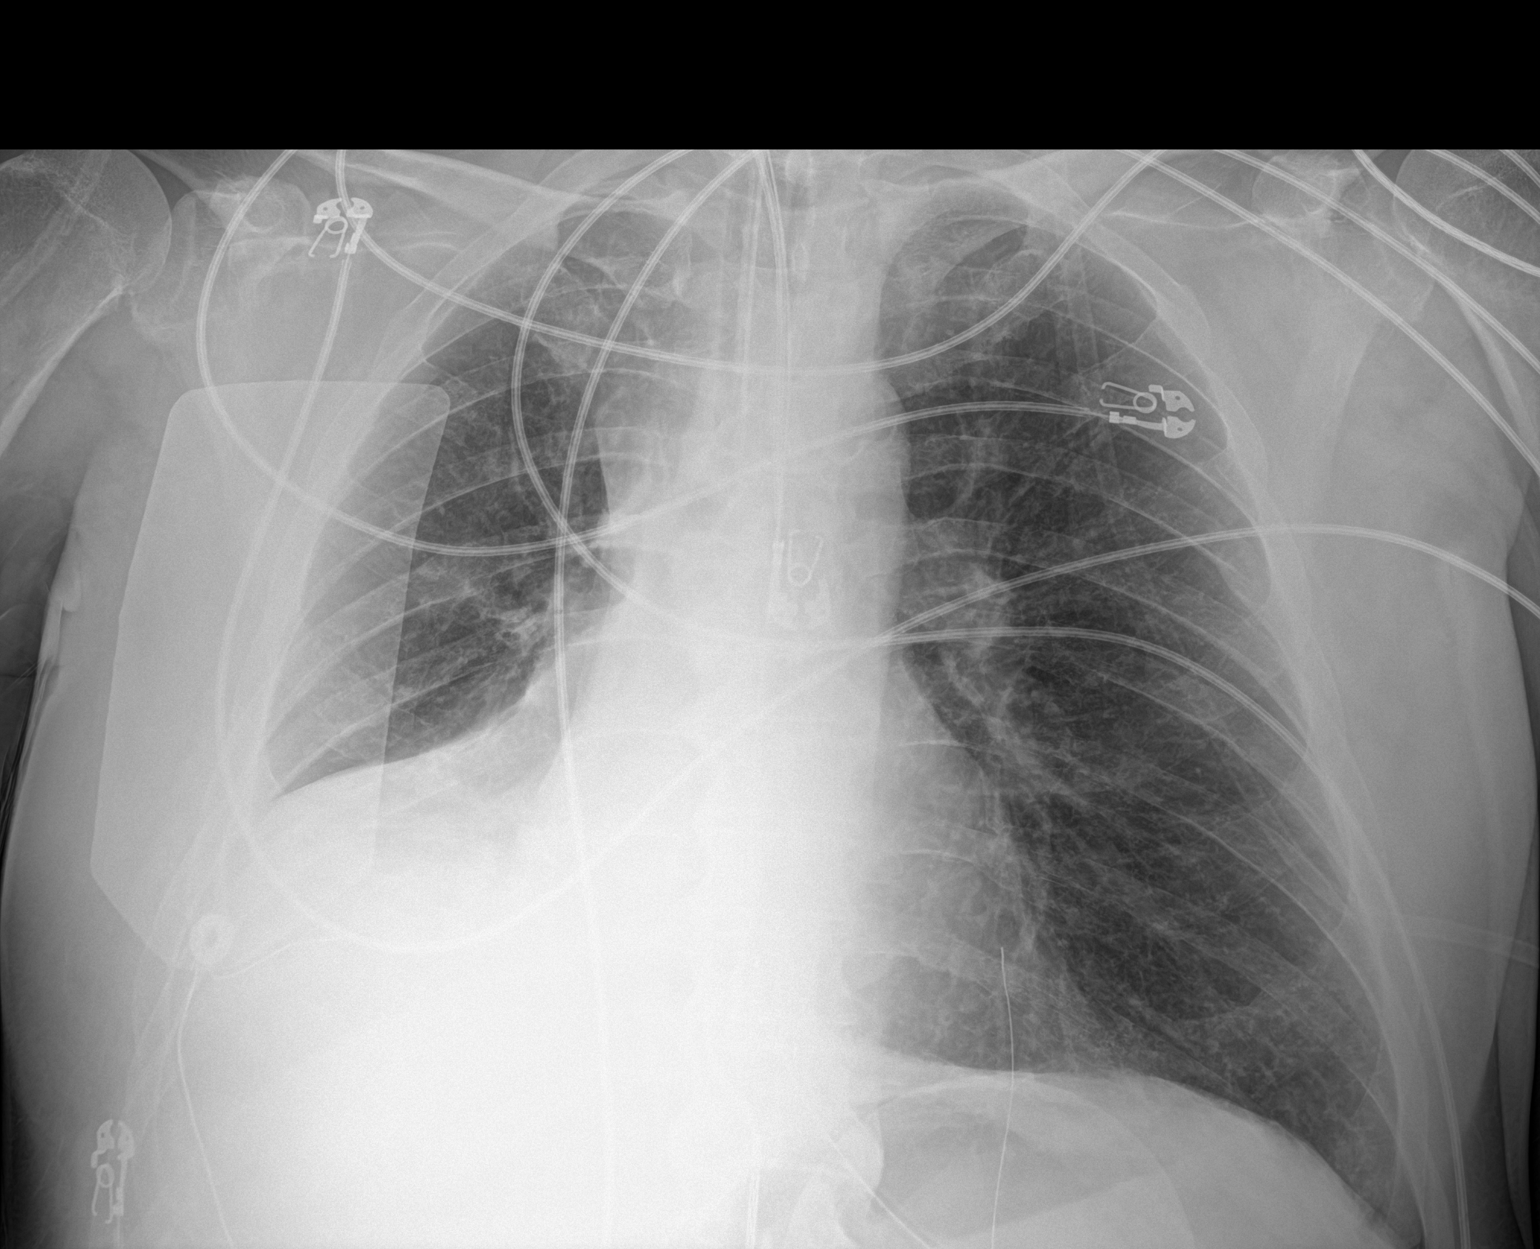
[im 2/2]
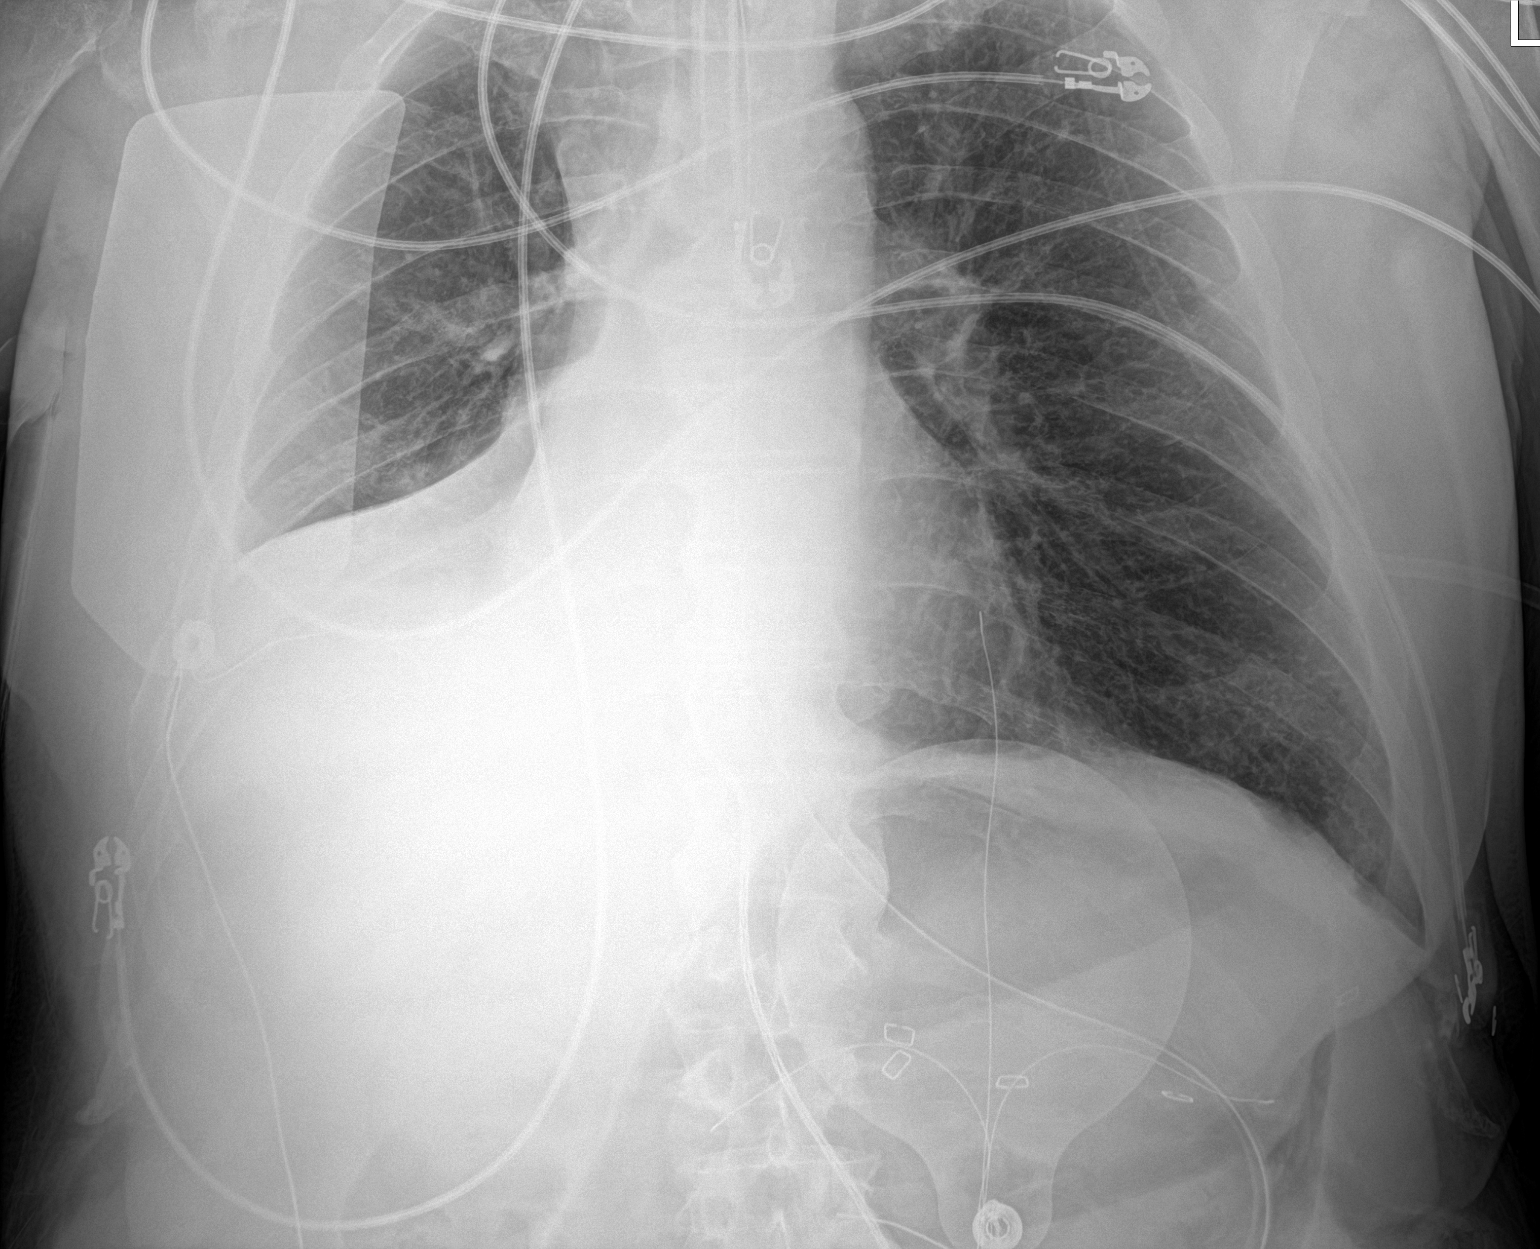

[2 of 2 positions shown; findings below may reference images not displayed]

FINDINGS: Endotracheal tube tip is 2 cm above the carina. Enteric tube extends
below the diaphragm.

There is new collapse of the right lower lobe. Left lung is clear.
Right pleural effusion is not excluded. No pneumothorax.
Cardiomediastinal silhouette is within normal limits. No fractures
are seen.
IMPRESSION: 1. There is new collapse of the right lower lobe, indeterminate. Can
not exclude right pleural effusion. Please correlate clinically.
2. Endotracheal tube tip 2 cm above carina.

## 2021-11-15 IMAGING — US US SCROTUM
3 series · 13 of 25 positions shown · non-contrast
Comparison: Abdominopelvic CT [DATE]

CLINICAL DATA: Right groin mass on CT.

EXAM:
ULTRASOUND OF SCROTUM
TECHNIQUE: Complete ultrasound examination of the testicles, epididymis, and
other scrotal structures was performed.

[Series 1: us pelvis limited (transabdominal only) · 36 acquisitions, 9 frames shown (1 of 3)]
[im 1/36]
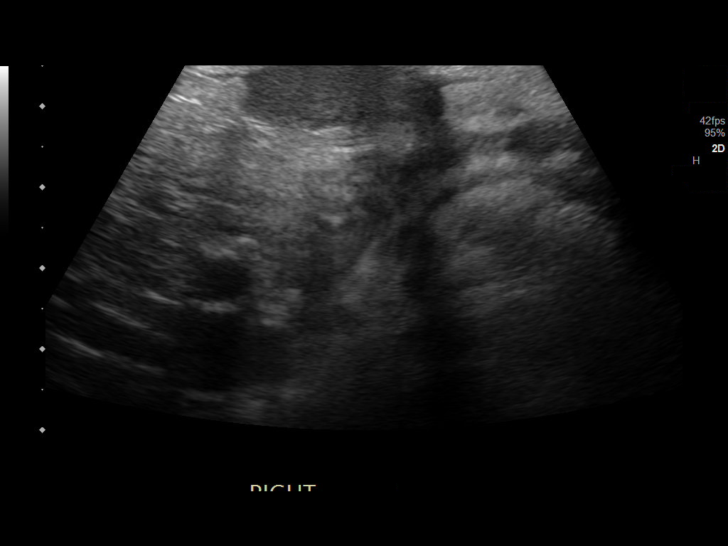
[im 5/36]
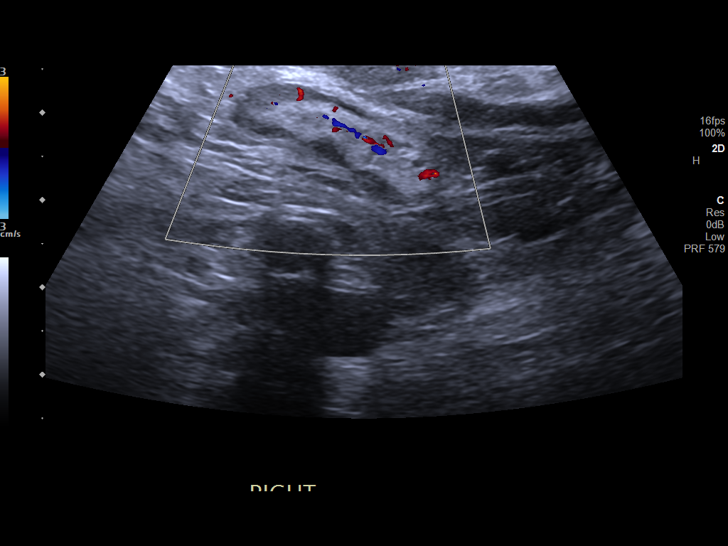
[im 9/36]
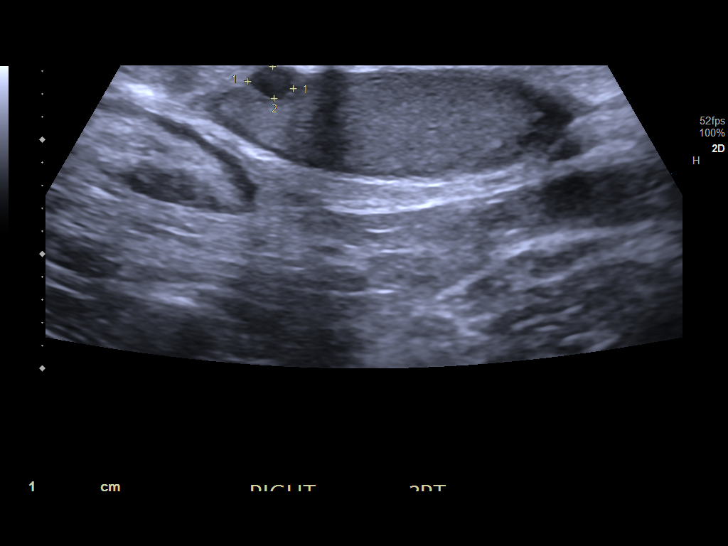
[im 13/36]
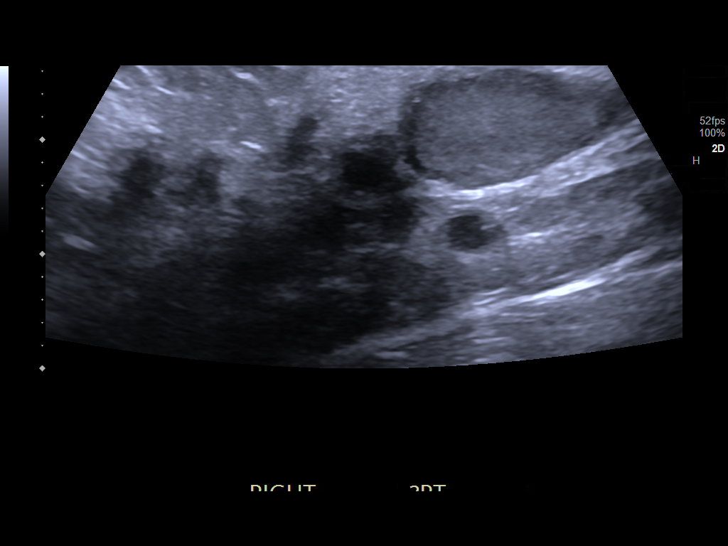
[im 17/36]
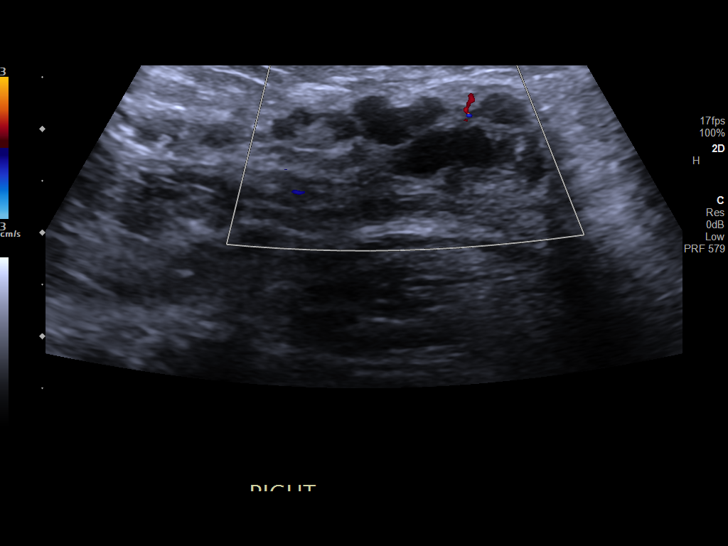
[im 21/36]
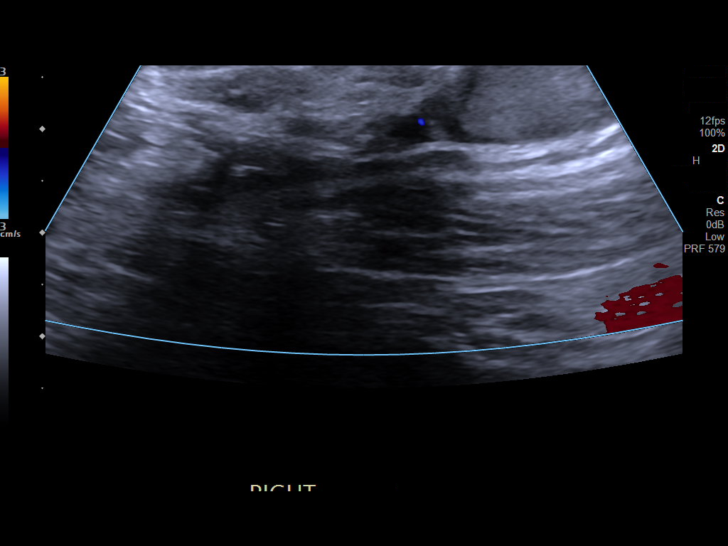
[im 25/36]
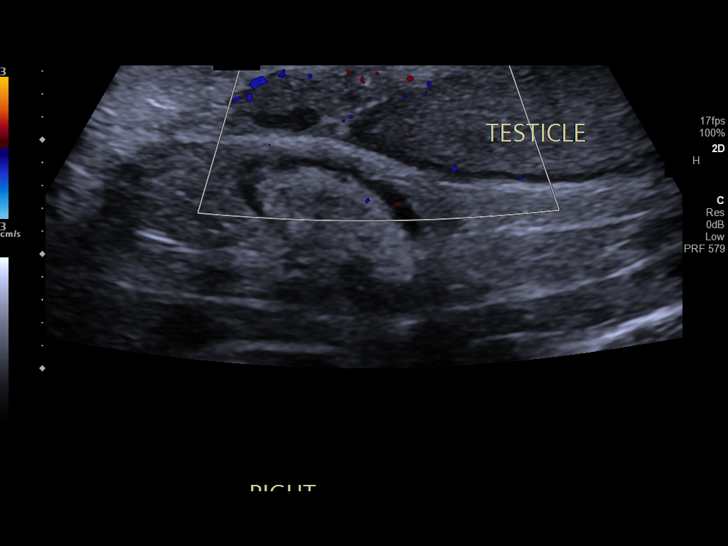
[im 29/36]
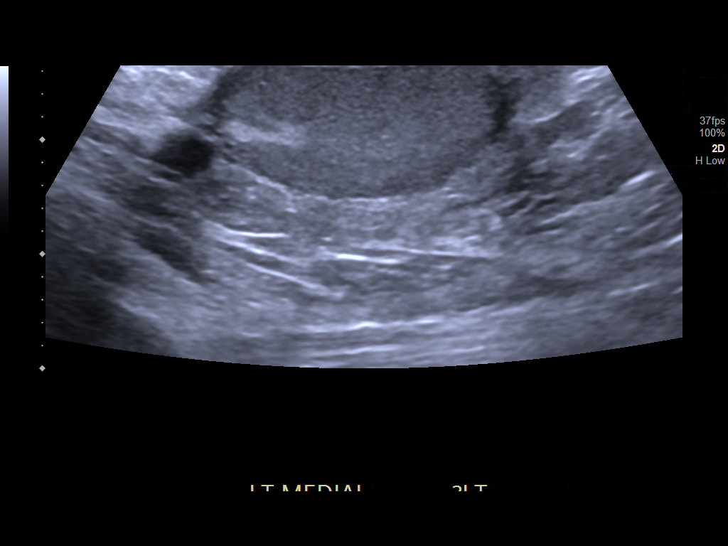
[im 33/36]
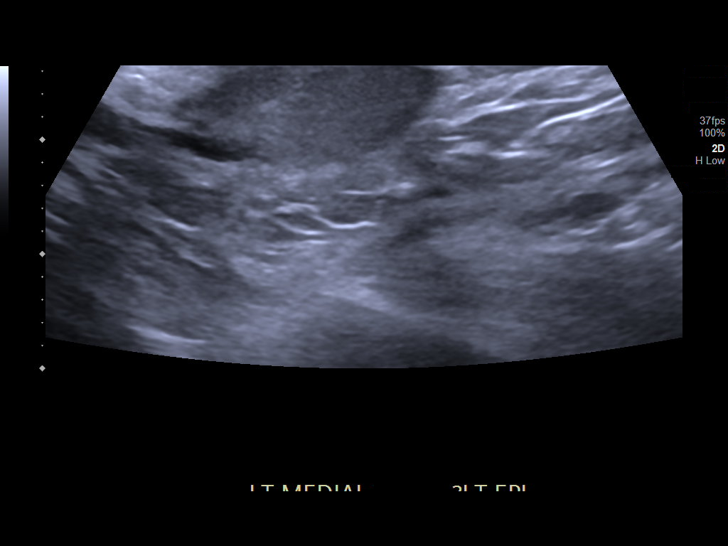

[Series 2: us pelvis limited (transabdominal only) · 1 of 3 slices shown (2 of 3)]
[im 1/3]
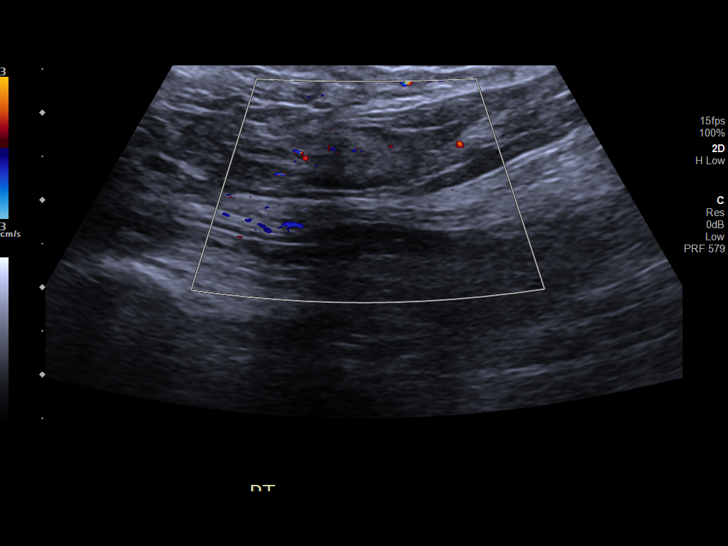

[Series 3: us pelvis limited (transabdominal only) · 3 of 10 slices shown (3 of 3)]
[im 1/10]
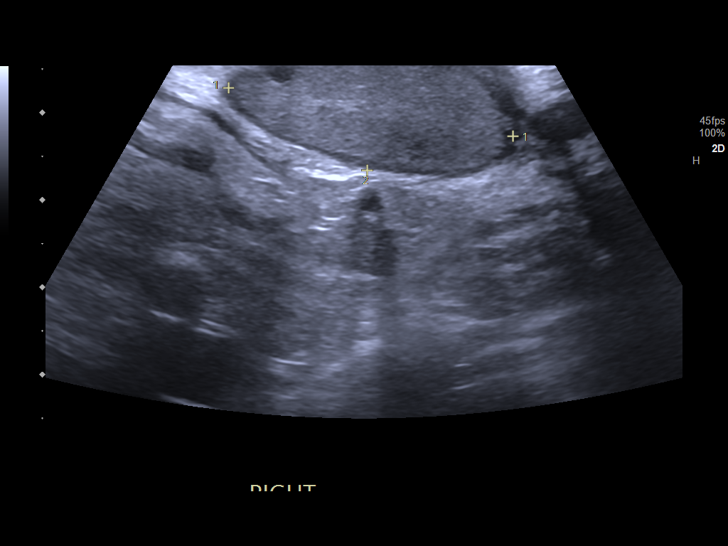
[im 5/10]
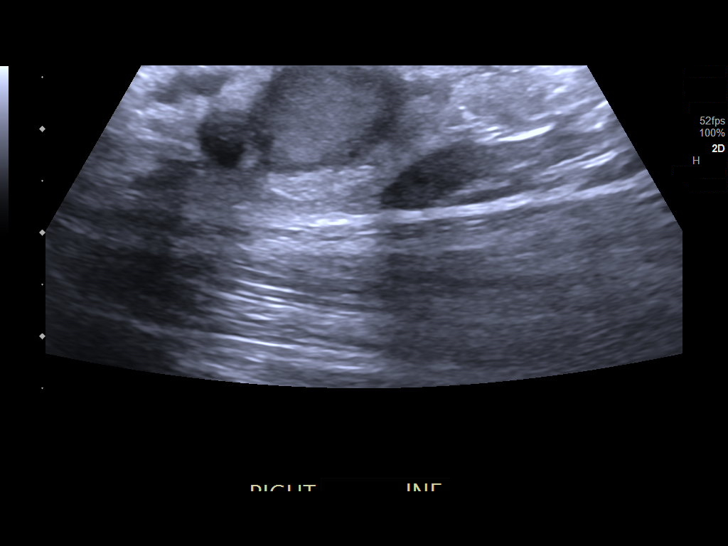
[im 10/10]
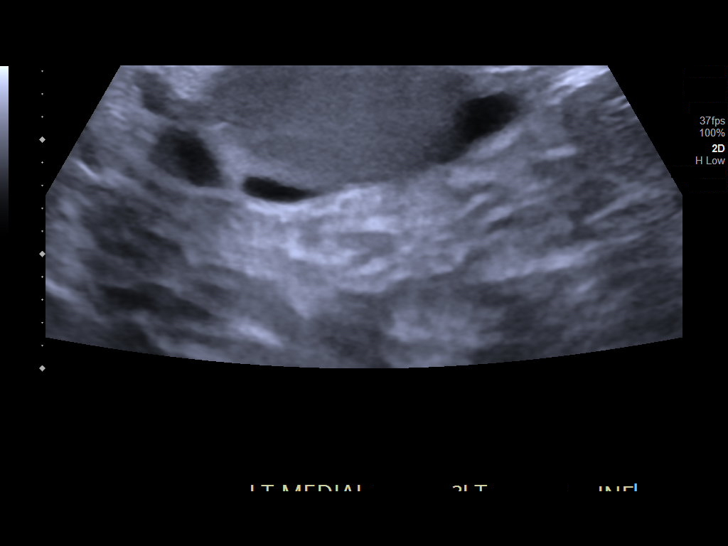

[13 of 25 positions shown; findings below may reference images not displayed]

FINDINGS: Right testicle

Presumed right testicle in the inguinal canal measuring 3.3 x 1.3 x
2 cm. The adjacent epididymis also appears to be in the inguinal
canal. Testicular blood flow is seen. There is a 4 mm peripheral
cyst, no definite solid mass. No microlithiasis

Left testicle

Presume left testicle is located in the anteromedial upper thigh
measuring 3.3 x 1.3 x 2.2 cm. No testicular mass. No microlithiasis.
Testicular blood flow is seen.

Right epididymis: Tentatively visualized adjacent to the inguinal
testis and unremarkable.

Left epididymis: Tentatively visualized adjacent to the thigh testis
and unremarkable.

Hydrocele:  None visualized.

Varicocele: Present bilaterally, least some of the varicoceles on
the right appear thrombosed.
IMPRESSION: 1. Abnormal positioning of both testis not within the scrotum.
Recommend correlation with physical exam.
2. The right testis appears to be within the inguinal canal and
corresponds to the soft tissue density on CT. There is a small
peripheral cyst within the right testis without solid mass.
3. The left testis appears to be within the anteromedial upper
thigh.
4. Bilateral varicoceles, some of which appear thrombosed on the
right.

## 2021-11-15 MED ORDER — MIDAZOLAM HCL 2 MG/2ML IJ SOLN
2.0000 mg | Freq: Once | INTRAMUSCULAR | Status: AC
  Administered 2021-11-15: 1 mg via INTRAVENOUS

## 2021-11-15 MED ORDER — SODIUM CHLORIDE 0.9% FLUSH
10.0000 mL | Freq: Two times a day (BID) | INTRAVENOUS | Status: DC
  Administered 2021-11-16: 10 mL
  Administered 2021-11-16: 30 mL

## 2021-11-15 MED ORDER — VASOPRESSIN 20 UNITS/100 ML INFUSION FOR SHOCK
0.0000 [IU]/min | INTRAVENOUS | Status: DC
  Administered 2021-11-16: 0.03 [IU]/min via INTRAVENOUS
  Filled 2021-11-15: qty 100

## 2021-11-15 MED ORDER — NOREPINEPHRINE 4 MG/250ML-% IV SOLN
2.0000 ug/min | INTRAVENOUS | Status: DC
  Filled 2021-11-15: qty 250

## 2021-11-15 MED ORDER — VASOPRESSIN 20 UNITS/100 ML INFUSION FOR SHOCK
INTRAVENOUS | Status: AC
  Administered 2021-11-16: 0.02 [IU]/min via INTRAVENOUS
  Filled 2021-11-15: qty 100

## 2021-11-15 MED ORDER — MIDAZOLAM HCL 2 MG/2ML IJ SOLN: 2.0000 mg | Freq: Once | INTRAMUSCULAR | Status: AC

## 2021-11-15 MED ORDER — MIDAZOLAM HCL 2 MG/2ML IJ SOLN
INTRAMUSCULAR | Status: AC
  Filled 2021-11-15: qty 2

## 2021-11-15 MED ORDER — MIDAZOLAM HCL 2 MG/2ML IJ SOLN
2.0000 mg | INTRAMUSCULAR | Status: DC | PRN
  Filled 2021-11-15: qty 2

## 2021-11-15 MED ORDER — DOCUSATE SODIUM 50 MG/5ML PO LIQD
100.0000 mg | Freq: Two times a day (BID) | ORAL | Status: DC
  Administered 2021-11-16: 100 mg
  Filled 2021-11-15: qty 10

## 2021-11-15 MED ORDER — ROCURONIUM BROMIDE 10 MG/ML (PF) SYRINGE: 80.0000 mg | PREFILLED_SYRINGE | Freq: Once | INTRAVENOUS | Status: AC

## 2021-11-15 MED ORDER — FENTANYL CITRATE (PF) 100 MCG/2ML IJ SOLN: 100.0000 ug | Freq: Once | INTRAMUSCULAR | Status: AC

## 2021-11-15 MED ORDER — SODIUM CHLORIDE 0.9 % IV BOLUS: 500.0000 mL | Freq: Once | INTRAVENOUS | Status: DC

## 2021-11-15 MED ORDER — ETOMIDATE 2 MG/ML IV SOLN
INTRAVENOUS | Status: AC
  Administered 2021-11-15: 20 mg via INTRAVENOUS
  Filled 2021-11-15: qty 20

## 2021-11-15 MED ORDER — SODIUM CHLORIDE 0.9 % IV SOLN
250.0000 mL | INTRAVENOUS | Status: DC
  Administered 2021-11-15: 250 mL via INTRAVENOUS
  Administered 2021-11-16: 500 mL via INTRAVENOUS

## 2021-11-15 MED ORDER — POLYETHYLENE GLYCOL 3350 17 G PO PACK
17.0000 g | PACK | Freq: Every day | ORAL | Status: DC
  Administered 2021-11-16: 17 g
  Filled 2021-11-15: qty 1

## 2021-11-15 MED ORDER — PANTOPRAZOLE 2 MG/ML SUSPENSION: 40.0000 mg | Freq: Every day | ORAL | Status: DC

## 2021-11-15 MED ORDER — SUCCINYLCHOLINE CHLORIDE 200 MG/10ML IV SOSY
PREFILLED_SYRINGE | INTRAVENOUS | Status: AC
  Filled 2021-11-15: qty 10

## 2021-11-15 MED ORDER — FENTANYL CITRATE (PF) 100 MCG/2ML IJ SOLN
50.0000 ug | Freq: Once | INTRAMUSCULAR | Status: AC
  Administered 2021-11-15: 50 ug via INTRAVENOUS

## 2021-11-15 MED ORDER — KETAMINE HCL 50 MG/5ML IJ SOSY
PREFILLED_SYRINGE | INTRAMUSCULAR | Status: AC
  Filled 2021-11-15: qty 5

## 2021-11-15 MED ORDER — IOHEXOL 350 MG/ML SOLN
70.0000 mL | Freq: Once | INTRAVENOUS | Status: AC | PRN
  Administered 2021-11-15: 70 mL via INTRAVENOUS

## 2021-11-15 MED ORDER — NOREPINEPHRINE 4 MG/250ML-% IV SOLN
2.0000 ug/min | INTRAVENOUS | Status: DC
  Administered 2021-11-15: 20 ug/min via INTRAVENOUS
  Administered 2021-11-15 (×2): 2 ug/min via INTRAVENOUS
  Filled 2021-11-15: qty 250

## 2021-11-15 MED ORDER — ETOMIDATE 2 MG/ML IV SOLN: 20.0000 mg | Freq: Once | INTRAVENOUS | Status: AC

## 2021-11-15 MED ORDER — VANCOMYCIN VARIABLE DOSE PER UNSTABLE RENAL FUNCTION (PHARMACIST DOSING)
Status: DC
Start: 2021-11-15 — End: 2021-11-16

## 2021-11-15 MED ORDER — SODIUM CHLORIDE 0.9% FLUSH: 10.0000 mL | INTRAVENOUS | Status: DC | PRN

## 2021-11-15 MED ORDER — FENTANYL CITRATE (PF) 100 MCG/2ML IJ SOLN
INTRAMUSCULAR | Status: AC
  Administered 2021-11-15: 100 ug via INTRAVENOUS
  Filled 2021-11-15: qty 2

## 2021-11-15 MED ORDER — IOHEXOL 300 MG/ML  SOLN
85.0000 mL | Freq: Once | INTRAMUSCULAR | Status: AC | PRN
  Administered 2021-11-15: 85 mL via INTRAVENOUS

## 2021-11-15 MED ORDER — NOREPINEPHRINE 4 MG/250ML-% IV SOLN
0.0000 ug/min | INTRAVENOUS | Status: DC
  Administered 2021-11-15: 20 ug/min via INTRAVENOUS
  Administered 2021-11-16 (×2): 22 ug/min via INTRAVENOUS
  Filled 2021-11-15 (×2): qty 250

## 2021-11-15 MED ORDER — QUETIAPINE FUMARATE 50 MG PO TABS: 50.0000 mg | ORAL_TABLET | Freq: Every day | ORAL | Status: DC

## 2021-11-15 MED ORDER — ROCURONIUM BROMIDE 10 MG/ML (PF) SYRINGE
PREFILLED_SYRINGE | INTRAVENOUS | Status: AC
  Administered 2021-11-15: 100 mg via INTRAVENOUS
  Filled 2021-11-15: qty 10

## 2021-11-15 MED ORDER — SODIUM CHLORIDE 0.9 % IV SOLN
2.0000 g | Freq: Two times a day (BID) | INTRAVENOUS | Status: DC
  Administered 2021-11-15 – 2021-11-16 (×2): 2 g via INTRAVENOUS
  Filled 2021-11-15 (×2): qty 20

## 2021-11-15 MED ORDER — FENTANYL CITRATE (PF) 100 MCG/2ML IJ SOLN
50.0000 ug | INTRAMUSCULAR | Status: DC | PRN
  Administered 2021-11-16: 100 ug via INTRAVENOUS
  Filled 2021-11-15 (×2): qty 2

## 2021-11-15 MED ORDER — MIDAZOLAM HCL 2 MG/2ML IJ SOLN: 2.0000 mg | INTRAMUSCULAR | Status: DC | PRN

## 2021-11-15 MED ORDER — SODIUM CHLORIDE 0.9 % IV SOLN: 250.0000 mL | INTRAVENOUS | Status: DC

## 2021-11-15 MED ORDER — SODIUM CHLORIDE 0.9 % IV BOLUS
250.0000 mL | Freq: Once | INTRAVENOUS | Status: AC | PRN
  Administered 2021-11-15: 250 mL via INTRAVENOUS

## 2021-11-15 MED ORDER — FENTANYL CITRATE (PF) 100 MCG/2ML IJ SOLN: 50.0000 ug | INTRAMUSCULAR | Status: DC | PRN

## 2021-11-15 MED ORDER — PANTOPRAZOLE 2 MG/ML SUSPENSION
40.0000 mg | Freq: Every day | ORAL | Status: DC
  Administered 2021-11-16: 40 mg
  Filled 2021-11-15: qty 20

## 2021-11-15 MED ORDER — SODIUM CHLORIDE 0.9 % IV BOLUS
750.0000 mL | Freq: Once | INTRAVENOUS | Status: AC | PRN
  Administered 2021-11-15: 750 mL via INTRAVENOUS

## 2021-11-15 MED ORDER — ORAL CARE MOUTH RINSE
15.0000 mL | Freq: Two times a day (BID) | OROMUCOSAL | Status: DC
  Administered 2021-11-15 – 2021-11-16 (×2): 15 mL via OROMUCOSAL

## 2021-11-15 NOTE — Progress Notes (Addendum)
eLink Physician-Brief Progress Note Patient Name: Henry Phillips DOB: 30-Jul-1960 MRN: [id number]   Date of Service  11/15/2021  HPI/Events of Note  Peri-intubation cardicac arrest. S/p 1 round CPR with ROSC. Suspected to be related to hypoxemia secondary to RLL collapse from suspected aspiration  Post-arrest labs:  Overall unremarkable  EKG NSR with PACs, incomplete RBBB unchanged compared to 11-15-21  CBC 30.2 Hg 11 Plt 323 Na 139 K 3.8 CO2 22  ABG with improved hypercarbia.  7.281/47/78  CXR RLL lung collapse  Camera check: Ground team placing CVC.  On levo 20  LA 2.7  Family updated by elink and ground team tonight.  eICU Interventions  Wean pressors for MAP goal >60 Trop, LA pending Echo ordered SSI changed to q4h     Intervention Category Major Interventions: Sepsis - evaluation and management  Ameliya Nicotra Mechele Collin 11/15/2021, 11:00 PM

## 2021-11-15 NOTE — Progress Notes (Addendum)
PROGRESS NOTE        PATIENT DETAILS Name: Henry Phillips Age: 61 y.o. Sex: male Date of Birth: 04-14-1961 Admit Date: 11/11/2021 Admitting Physician Evalee Mutton Kristeen Mans, MD PCP:Pcp, No  Brief Summary: Patient is a 61 y.o.  male with recent history of right foot osteomyelitis-s/p TMA on 4/20 at [facility] IV Vanco/cefepime/Flagyl until 5/31-ESRD on HD TTS-admitted for evaluation of confusion and malignant hypertension.  Significant events: 5/30>> transfer from [place] for evaluation of confusion/uncontrolled hypertension.  Transferred on IV Cardene. 5/31>> completed treatment with IV vancomycin/cefepime/Flagyl 6/1>>still confused-vomiting 6/2>>obtunded-wbc count up to 30K  Significant studies: 5/29>> CT head (done at [facility]): No acute abnormalities. 5/29>> x-ray right/left foot (done at [facility]): No obvious osteomyelitis 5/30>> MRI brain: No acute intracranial abnormality 5/30>> Spot EEG: Negative for seizures. 5/31>> B12: 526 5/31>> ammonia: Within normal limits 5/31>> TSH: Within normal limits 5/31>> vitamin B1: Pending 6/01>> CT abdomen pelvis: Esophageal thickening-large air-fluid noted in the stomach.  Low-attenuation area in the subcutaneous tissue of the right inguinal region-1.2 x 3.8 x 2.6 cm. 6/02>> CXR: No PNA  Significant microbiology data: 6/01>> blood culture: Pending 6/01>> blood culture: Pending  Procedures: None  Consults: Nephrology Neurology ID  Subjective: Obtunded-will occasionally open eyes to a sternal rub. On 6-7L of O2   Objective: Vitals: Blood pressure (!) 99/50, pulse 95, temperature 98.2 F (36.8 C), temperature source Axillary, resp. rate 16, weight 77 kg, SpO2 93 %.   Exam: Gen Exam: Barely arousable but not in any distress. HEENT:atraumatic, normocephalic Chest: Scattered rhonchi. CVS:S1S2 regular Abdomen:soft non tender, non distended Extremities:no edema.  Right TMA site  with 1 small opening-serosanguineous-but does not look overtly infected to me. Neurology: Difficult exam but seems to be moving all 4 extremities. Skin: no rash   Pertinent Labs/Radiology:    Latest Ref Rng & Units 11/15/2021    2:38 AM 11/14/2021   12:51 AM 11/13/2021    2:24 AM  CBC  WBC 4.0 - 10.5 K/uL 30.2   15.8   12.0    Hemoglobin 13.0 - 17.0 g/dL 12.5   11.8   11.1    Hematocrit 39.0 - 52.0 % 38.0   36.0   33.0    Platelets 150 - 400 K/uL 214   231   198      Lab Results  Component Value Date   NA 139 11/15/2021   K 3.8 11/15/2021   CL 92 (L) 11/15/2021   CO2 21 (L) 11/15/2021       Assessment/Plan: Acute metabolic encephalopathy: Encephalopathy has worsened today-he is essentially obtunded-White count up to 30,000 today-he is hypoxic and requiring around 6 L of oxygen.  Etiology for encephalopathy that brought him to the hospital remains unclear-initially thought to have PRES-however MRI brain negative.  Subsequently thought to have cefepime induced neurotoxicity-completed cefepime on 5/31.  Given worsening encephalopathy today with significantly elevated white count-he could have a infection-however no obvious source is apparent.  Highly unlikely that he will have a bacterial meningitis at this point-initially-viral encephalitis was felt to be unlikely given his clinical presentation.  Now that his white count is going up-and no other obvious foci of infection is apparent-neurology was contemplating doing LP-however he is on Plavix-and will need to be held for a few days before this can be done.  Neurology planning on LTM  EEG.  See below regarding leukocytosis/developing sepsis.  Since he is now lethargic/obtunded-hold as needed Ativan/Haldol and scheduled Seroquel/melatonin (agitated till yesterday)  Sepsis: Significant worsening of his leukocytosis-he remains severely encephalopathic/obtunded today.  He just completed almost 6 weeks of IV Vanco/cefepime/Flagyl on 5/31.  No obvious  foci of infection apparent-see above.  Since he was vomiting yesterday-concerned that he may be aspirating-however CT abdomen done yesterday did not show any pneumonia in the lung cuts-CXR done today does not show any obvious infiltrates.  I will go ahead and get a CT angio chest-to make sure he does not have a PE-but also to better evaluate his lung parenchyma to rule out aspiration.  Although his right foot does not look overtly infected to me-I will go ahead and get a MRI of his right foot.  Repeat blood cultures today.  I have consulted ID to see if we need to cover him empirically with antimicrobial therapy as well.  Will await further recommendations from infectious disease.  Hypertensive emergency: Was on nicardipine infusion at [facility] ED-blood pressure now better controlled-and in fact on the lower side.  Per patient's sister-patient's blood pressure usually runs low on dialysis days.  Vomiting: Probably due to brewing sepsis-he probably has gastroparesis as reflected on CT abdomen findings.  Keep n.p.o.-he is not vomiting today.  Once encephalopathy improves-we can contemplate if he needs further investigations etc.  Continue as needed antiemetics.  ?  Small hypodensity in right groin: Seen on CT-we will try and obtain a ultrasound.  I do not see anything obvious on physical exam.  Osteomyelitis of right foot-s/p right TMA by podiatry at [facility] on 10/03/2021: Amputation site looks benign-Per review of discharge summary-last day for Vanco/cefepime/Flagyl 5/31.  See above regarding plans for MRI of his right foot.  ESRD: Nephrology consulted for HD.  Normocytic anemia: Mild-follow hemoglobin-defer Aranesp/IV iron to nephrology  PAD: Hold Plavix given need for potential LP.  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  Recent Labs    11/14/21 1649 11/14/21 2048 11/15/21 0853  GLUCAP 108* 130* 191*    Palliative care:  Worsening encephalopathy-multiple medical problems-has significant medical problems at baseline.  Long discussion with patient's sister [name] over the phone on 6/2-she is aware of his tenuous clinical condition-potential for further decline-potential for perhaps a brewing infection given worsening leukocytosis.  Patient does not have spouse/children-his sister is his primary caregiver-he lives with her.  Sister believes that he would not want life-prolonging measures if he continued to deteriorate-as she believes he has been through a lot over the past few years.  She is agreeable with a DNR order-however she wants to continue all investigations/IV antibiotics/consultations for now.  She is aware that if he deteriorates in spite of our best efforts-apart from transitioning to hospice care-really no other options.  I will go ahead and put a palliative care consultation as well.   Code status:   Code Status: DNR   DVT Prophylaxis: SCDs Start: 10/23/2021 0027   Family Communication: sister [name] [phone] called on 6/02-see above discussion.   Disposition Plan: Status is: Observation The patient will require care spanning > 2 midnights and should be moved to inpatient because: Persistent encephalopathy-not yet at baseline-not yet stable for discharge.   Planned Discharge Destination:Home health   Diet: Diet Order             Diet NPO time specified Except for: Sips with Meds, Ice Chips  Diet  effective now                     Antimicrobial agents: Anti-infectives (From admission, onward)    Start     Dose/Rate Route Frequency Ordered Stop   11/13/21 1200  vancomycin (VANCOCIN) IVPB 1000 mg/200 mL premix        1,000 mg 200 mL/hr over 60 Minutes Intravenous Every M-W-F (Hemodialysis) 11/04/2021 2000 11/13/21 1430   11/08/2021 2200  metroNIDAZOLE (FLAGYL) tablet 500 mg        500 mg Oral Every 12 hours 11/10/2021 1021 11/14/21 0959   10/20/2021 1600  ceFEPIme (MAXIPIME) 2 g in sodium  chloride 0.9 % 100 mL IVPB        2 g 200 mL/hr over 30 Minutes Intravenous  Once 10/14/2021 0713 10/15/2021 2056   11/04/2021 1200  vancomycin (VANCOCIN) IVPB 1000 mg/200 mL premix  Status:  Discontinued        1,000 mg 200 mL/hr over 60 Minutes Intravenous Every T-Th-Sa (Hemodialysis) 10/14/2021 0713 10/28/2021 1958   11/09/2021 0800  metroNIDAZOLE (FLAGYL) IVPB 500 mg  Status:  Discontinued        500 mg 100 mL/hr over 60 Minutes Intravenous Every 12 hours 10/22/2021 0705 11/09/2021 1021        MEDICATIONS: Scheduled Meds:  amLODipine  5 mg Oral Daily   Chlorhexidine Gluconate Cloth  6 each Topical Q0600   doxercalciferol  3 mcg Intravenous Q T,Th,Sa-HD   insulin aspart  0-5 Units Subcutaneous QHS   insulin aspart  0-6 Units Subcutaneous TID WC   insulin glargine-yfgn  10 Units Subcutaneous QHS   metoprolol tartrate  50 mg Oral BID   Continuous Infusions:   PRN Meds:.acetaminophen **OR** acetaminophen, haloperidol lactate **OR** haloperidol lactate, hydrALAZINE, labetalol, LORazepam, ondansetron **OR** ondansetron (ZOFRAN) IV, prochlorperazine   I have personally reviewed following labs and imaging studies  LABORATORY DATA: CBC: Recent Labs  Lab 10/27/2021 0038 11/13/21 0224 11/14/21 0051 11/15/21 0238  WBC 12.8* 12.0* 15.8* 30.2*  NEUTROABS 9.2*  --   --  26.4*  HGB 11.5* 11.1* 11.8* 12.5*  HCT 33.4* 33.0* 36.0* 38.0*  MCV 91.5 92.2 92.5 92.9  PLT 205 198 231 465    Basic Metabolic Panel: Recent Labs  Lab 11/11/2021 0038 11/13/21 0224 11/14/21 0051 11/15/21 0238  NA 141 141 140 139  K 4.4 4.0 3.5 3.8  CL 102 104 97* 92*  CO2 24 20* 25 21*  GLUCOSE 212* 173* 82 140*  BUN 44* 53* 29* 30*  CREATININE 8.40* 10.18* 6.68* 5.87*  CALCIUM 9.8 9.9 9.7 9.7  MG 2.1  --   --   --   PHOS  --  4.6  --   --     GFR: CrCl cannot be calculated (Unknown ideal weight.).  Liver Function Tests: Recent Labs  Lab 10/27/2021 0038 11/13/21 0224 11/14/21 0051 11/15/21 0238  AST 16   --  29 20  ALT 13  --  16 16  ALKPHOS 33*  --  35* 34*  BILITOT 0.7  --  1.4* 1.9*  PROT 7.7  --  8.3* 7.5  ALBUMIN 3.3* 3.1* 3.5 3.4*   Recent Labs  Lab 11/14/21 0051  LIPASE 82*   Recent Labs  Lab 11/13/21 0614  AMMONIA 26    Coagulation Profile: No results for input(s): INR, PROTIME in the last 168 hours.  Cardiac Enzymes: No results for input(s): CKTOTAL, CKMB, CKMBINDEX, TROPONINI in the last 168 hours.  BNP (last  3 results) No results for input(s): PROBNP in the last 8760 hours.  Lipid Profile: No results for input(s): CHOL, HDL, LDLCALC, TRIG, CHOLHDL, LDLDIRECT in the last 72 hours.  Thyroid Function Tests: Recent Labs    11/13/21 0614  TSH 1.489    Anemia Panel: Recent Labs    11/13/21 0614  VITAMINB12 526    Urine analysis: No results found for: COLORURINE, APPEARANCEUR, LABSPEC, PHURINE, GLUCOSEU, HGBUR, BILIRUBINUR, KETONESUR, PROTEINUR, UROBILINOGEN, NITRITE, LEUKOCYTESUR  Sepsis Labs: Lactic Acid, Venous No results found for: LATICACIDVEN  MICROBIOLOGY: No results found for this or any previous visit (from the past 240 hour(s)).  RADIOLOGY STUDIES/RESULTS: DG Chest 1 View  Result Date: 11/14/2021 CLINICAL DATA:  Leukocytosis and vomiting. EXAM: CHEST  1 VIEW COMPARISON:  None Available. FINDINGS: The heart size and mediastinal contours are within normal limits. Both lungs are clear. Multilevel degenerative changes are seen throughout the thoracic spine. IMPRESSION: No active cardiopulmonary disease. Electronically Signed   By: Virgina Norfolk M.D.   On: 11/14/2021 19:07   CT ABDOMEN PELVIS W CONTRAST  Result Date: 11/15/2021 CLINICAL DATA:  Nausea and vomiting.  On dialysis. EXAM: CT ABDOMEN AND PELVIS WITH CONTRAST TECHNIQUE: Multidetector CT imaging of the abdomen and pelvis was performed using the standard protocol following bolus administration of intravenous contrast. RADIATION DOSE REDUCTION: This exam was performed according to the  departmental dose-optimization program which includes automated exposure control, adjustment of the mA and/or kV according to patient size and/or use of iterative reconstruction technique. CONTRAST:  5mL OMNIPAQUE IOHEXOL 300 MG/ML  SOLN COMPARISON:  None Available. FINDINGS: Lower chest: No acute abnormality. Hepatobiliary: No focal liver abnormality is seen. No gallstones, gallbladder wall thickening, or biliary dilatation. Pancreas: Unremarkable. No pancreatic ductal dilatation or surrounding inflammatory changes. Spleen: Normal in size without focal abnormality. Adrenals/Urinary Tract: There is bilateral renal atrophy. There is no hydronephrosis. Adrenal glands are within normal limits. There is mild diffuse bladder wall thickening with mild surrounding inflammatory stranding. Stomach/Bowel: There is a large air-fluid level in the stomach. There is some fluid distention of the distal esophagus and distal esophageal wall thickening. Appendix appears normal. No evidence of bowel wall thickening, distention, or inflammatory changes. Vascular/Lymphatic: Aortic atherosclerosis. No enlarged abdominal or pelvic lymph nodes. There are mildly enlarged right inguinal lymph nodes measuring up to 11 mm short axis. Reproductive: Prostate is unremarkable. Other: No abdominal wall hernia or abnormality. No abdominopelvic ascites. There is a 1.2 x 3.8 x 2.6 cm nonenhancing low attenuation area in the right inguinal region/proximal anterior thigh. This is incompletely imaged. Degenerative changes affect the spine. Musculoskeletal: No acute or significant osseous findings. IMPRESSION: 1. Mild bladder wall thickening and inflammation concerning for cystitis. 2. Esophageal wall thickening and inflammation worrisome for esophagitis. There is fluid in the distal esophagus compatible with gastroesophageal reflux. 3. Large air-fluid level noted in the stomach. Please correlate clinically for gastroparesis or gastric outlet  obstruction. 4. Low-attenuation area in the subcutaneous tissues of the right inguinal region/proximal anterior thigh measuring 1.2 x 3.8 x 2.6 cm. Findings are indeterminate and may represent a complex fluid collection or other soft tissue mass. There are mildly enlarged right inguinal lymph nodes. Recommend clinical correlation and follow-up. Electronically Signed   By: Ronney Asters M.D.   On: 11/15/2021 02:14   DG Chest Port 1V same Day  Result Date: 11/15/2021 CLINICAL DATA:  Shortness of breath. EXAM: PORTABLE CHEST 1 VIEW COMPARISON:  Chest x-ray from yesterday. FINDINGS: The heart size and mediastinal contours are  within normal limits. Both lungs are clear. The visualized skeletal structures are unremarkable. IMPRESSION: No active disease. Electronically Signed   By: Titus Dubin M.D.   On: 11/15/2021 09:03   DG Abd Portable 1V  Result Date: 11/14/2021 CLINICAL DATA:  Leukocytosis, vomiting. EXAM: PORTABLE ABDOMEN - 1 VIEW COMPARISON:  None Available. FINDINGS: The bowel gas pattern is normal. Surgical clips overlie the abdomen. Vascular/seminal vesicle calcifications. Levoconvex curvature of the lumbar spine with associated degenerative change. IMPRESSION: Nonobstructive bowel gas pattern. Electronically Signed   By: Dahlia Bailiff M.D.   On: 11/14/2021 18:11   EEG adult  Result Date: 11/15/2021 Lora Havens, MD     11/15/2021  8:26 AM Patient Name: Siddhant Hashemi MRN: [id number] Epilepsy Attending: Lora Havens Referring Physician/Provider: Amie Portland, MD Date: 11/14/2021 Duration: 24.52 mins Patient history: 61 year old male with sudden onset confusion and altered mental status.  EEG to evaluate for seizure. Level of alertness:  lethargic AEDs during EEG study: None Technical aspects: This EEG study was done with scalp electrodes positioned according to the 10-20 International system of electrode placement. Electrical activity was acquired at a sampling rate of 500Hz  and reviewed  with a high frequency filter of 70Hz  and a low frequency filter of 1Hz . EEG data were recorded continuously and digitally stored. Description: No clear posterior dominant rhythm was seen.  EEG showed continuous generalized polymorphic mixed frequencies with predominantly 5 to 8 Hz theta-alpha activity admixed with intermittent generalized 2 to 3 Hz delta slowing.  Hyperventilation and photic stimulation were not performed.   ABNORMALITY - Continuous slow, generalized IMPRESSION: This study is suggestive of moderate diffuse encephalopathy, nonspecific etiology. No seizures or epileptiform discharges were seen throughout the recording. [place]     LOS: 3 days   Oren Binet, MD  [facility]    To contact the attending provider between 7A-7P or the covering provider during after hours 7P-7A, please log into the web site [email or web address] and access using universal [place] password for that web site. If you do not have the password, please call the hospital operator.  11/15/2021, 9:50 AM

## 2021-11-15 NOTE — Progress Notes (Signed)
OT Cancellation Note  Patient Details Name: Henry Phillips MRN: [id number] DOB: July 20, 1960   Cancelled Treatment:    Reason Eval/Treat Not Completed: Medical issues which prohibited therapy (Pt with decline in medical status.)  Malka So 11/15/2021, 11:38 AM Nestor Lewandowsky, OTR/L Acute Rehabilitation Services Pager: [phone] Office: [phone]

## 2021-11-15 NOTE — Consult Note (Signed)
[place] for Infectious Disease    Date of Admission:  10/22/2021     Total days of antibiotics 4               Reason for Consult: Leukocytosis / Encephalopathy  Referring Provider: Dr. Sloan Leiter Primary Care Provider: Pcp, No   ASSESSMENT:  Henry Phillips is a 61 y/o male with ESRD on HD with recent history or right transmetatarsal amputation transferred from outside hospital with acute metabolic encephalopathy and hypertension. Course complicated by acute worsening encephalopathy and concern for infection with leukocytosis of 20.2. Unlikely cefepime toxicity as cefepime is dialyzable and his last dialysis was Saturday. He is obtunded and not following commands in the setting of previous Seroquel and Haldol administration complicated by increasing oxygen need and hypotension. Ammonia level normal. No seizure activity on EEG. CT angio chest and MRI right foot pending. Blood cultures on 6/1 are without growth in <24 hours. Neurology holding Plavix for possible LP. Continue with broad spectrum coverage with vancomycin and ceftriaxone pending additional work up. Remaining medical and supportive care per primary team.   PLAN:  Continue vancomycin and ceftriaxone Monitor cultures for bacteremia.  Await CT angio chest and MRI right foot for evidence of infection.  Vasopressor support per CCM Dialysis per Nephrology. Remaining medical and supportive care per primary team.    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 male with previous medical history of Type 2 diabetes, hypertension, chronic osteomyelitis, and ESRD on hemodialysis (left arm fistula) transferred to [facility] from outside hospital for altered mental status.   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 There was concern for persistence of osteomyelitis following surgery. Previous cultures of the right foot have been polymicrobial with Staphylococcus lugdunensis, Streptococcus Group C and G, and Actinotignum Schaalii. Previous cultures with corynebacterium species, stenotrophomonas maltophilia, finegoldia magna, and MSSA. Placed on Vancomycin and Cefepime with dialysis through 11/13/21. Seen by Podiatry on 10/18/21 with improvements in his wound since surgery and was continued with antibiotics and wound vac. Was in his normal state of health until 11/11/21 when he was noted to have altered mental status and not acting normal and combative. Normally A&O x 4.  Brought to the [facility] ED via EMS and found to have hypertension and encephalopathy. Foot appealed to be healing with serous drainage. X-ray of left foot with no areas of osseous erosion. X-ray right foot with soft tissue swelling at the amputation site and no areas of osseous erosion. CT head with no evidence of acute infarct or hemorrhage. Started on nicardipine gtt and transferred to [facility].   Seen by Neurology upon arrival to [facility] with concern for metabolic encephalopathy and possible cefepime related toxicity. MRI brain with no acute intracranial abnormality. Slightly improved on  6/1, however began vomiting around 1400 and occurred 3-4 times. X-ray abdomen with no small bowel obstruction and CT abdomen/pelvis with mild bladder thickening and inflammation concern for cystitis; large air  fluid level in stomach; and low attenuation area in the subcutaneous tissues of the right inguinal region. Had episodes of desaturation overnight and once again became combative. EEG with moderate diffuse encephalopathy. More lethargic on 6/2 and requiring supplemental oxygen opening eyes to deep sternal rub. He had received Haldol and Seroquel the prior evening. Chest x-ray without pneumonia. Concerned for infection with worsening leukocytosis up to 30.2. Given vomiting there was concern for aspiration CT angio chest ordered and MRI of right foot to check for infection. Neurology considering LP but on Plavix so will need to wait. Course was further complicated by additional lethargy, hypotension and increased oxygen demand. Moved to the ICU and started on vasopressors and vancomycin and ceftriaxone for possible sepsis of unclear origin.   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 rate (!) 31, weight 77 kg, SpO2 97 %.  Physical Exam Constitutional:      General: He is not in acute distress.    Appearance: He is well-developed. He is ill-appearing.      Interventions: Nasal cannula in place.     Comments: Lying in bed with head of bed elevated; grimace to painful stimuli but no eye opening or withdrawing  Cardiovascular:     Rate and Rhythm: Normal rate and regular rhythm.     Heart sounds: Normal heart sounds.     Comments: Left fistula appears without evidence of infection.  Pulmonary:     Effort: Pulmonary effort is normal.     Breath sounds: Normal breath sounds.  Musculoskeletal:     Comments: Post surgical wound appears without significant redness, drainage or odor.   Skin:    General: Skin is warm and dry.    Lab Results Lab Results  Component Value Date   WBC 30.2 (H) 11/15/2021   HGB 12.5 (L) 11/15/2021   HCT 38.0 (L) 11/15/2021   MCV 92.9 11/15/2021   PLT 214 11/15/2021    Lab Results  Component Value Date   CREATININE 5.87 (H) 11/15/2021   BUN 30 (H) 11/15/2021   NA 139 11/15/2021   K 3.8 11/15/2021   CL 92 (L) 11/15/2021   CO2 21 (L) 11/15/2021    Lab Results  Component Value Date   ALT 16 11/15/2021   AST 20 11/15/2021  ALKPHOS 34 (L) 11/15/2021   BILITOT 1.9 (H) 11/15/2021     Microbiology: Recent Results (from the past 240 hour(s))  Culture, blood (Routine X 2) w Reflex to ID Panel     Status: None (Preliminary result)   Collection Time: 11/14/21  3:31 PM   Specimen: BLOOD  Result Value Ref Range Status   Specimen Description BLOOD RIGHT ANTECUBITAL  Final   Special Requests   Final    BOTTLES DRAWN AEROBIC AND ANAEROBIC Blood Culture adequate volume   Culture   Final    NO GROWTH < 24 HOURS Performed at [facility], [place] [address], [place], [place] [address]    Report Status PENDING  Incomplete  Culture, blood (Routine X 2) w Reflex to ID Panel     Status: None (Preliminary result)   Collection Time: 11/14/21  3:37 PM   Specimen: BLOOD  Result Value Ref Range Status   Specimen Description BLOOD RIGHT ANTECUBITAL  Final   Special Requests   Final    BOTTLES DRAWN AEROBIC AND  ANAEROBIC Blood Culture adequate volume   Culture   Final    NO GROWTH < 24 HOURS Performed at [facility], [place] [address] [place], [place] [address]    Report Status PENDING  Incomplete     Terri Piedra, [place] [facility] Infectious [facility]  11/15/2021  1:31 PM

## 2021-11-15 NOTE — Procedures (Signed)
Arterial Catheter Insertion Procedure Note  Henry Phillips  [id number]  1960-12-06  Date:11/15/21  Time:10:13 PM    Provider Performing: Governor Specking C    Procedure: Insertion of Arterial Line [phone]) without US guidance  Indication(s) Blood pressure monitoring and/or need for frequent ABGs  Consent Unable to obtain consent due to emergent nature of procedure.  Anesthesia None   Time Out Verified patient identification, verified procedure, site/side was marked, verified correct patient position, special equipment/implants available, medications/allergies/relevant history reviewed, required imaging and test results available.   Sterile Technique Maximal sterile technique including full sterile barrier drape, hand hygiene, sterile gown, sterile gloves, mask, hair covering, sterile ultrasound probe cover (if used).   Procedure Description Area of catheter insertion was cleaned with chlorhexidine and draped in sterile fashion. Without real-time ultrasound guidance an arterial catheter was placed into the right radial artery.  Appropriate arterial tracings confirmed on monitor.     Complications/Tolerance None; patient tolerated the procedure well.   EBL Minimal   Specimen(s) None

## 2021-11-15 NOTE — Progress Notes (Addendum)
Rapid response RN Wilburn Cornelia notified to come assess pt and give input on care for pt.   Pt now on 7L O2 high flow meter per RT, pt remains sleeping/lethargic, skin cool and diaphoretic.   MD aware and has seen pt also this am.   RR RN will be up to see pt.

## 2021-11-15 NOTE — Progress Notes (Signed)
Patient's blood pressure now in the 83J systolic-he remains obtunded.  Reached out to patient's sister-Cathy again-maintain DNR-but okay to try IV antibiotics-IV pressors for a few days.  If no improvement-family will consider transitioning to comfort measures.  Spoke with ID-they will start patient on empiric vancomycin/ceftriaxone-spoke with PCCM regarding potential ICU transfer.  In the meantime-we will go ahead and bolus 250 cc IV fluids.

## 2021-11-15 NOTE — Progress Notes (Addendum)
Pt to transfer to ICU for pressors. Report given to RN Bri on unit [facility], pt transferring to ICU room [id number].  RR RN Wilburn Cornelia assisting with transport over to 3MW.

## 2021-11-15 NOTE — Progress Notes (Addendum)
Neurology Progress Note   S:// Patient is quite lethargic this am on nasal cannula 6LPM. To deep sternal rub he will open his eyes. He received. He received seroquel and melatonin @ 2100 and haldol around 2300 last pm. ABG drawn this am 7.47/28 CO2/ 58 O2. No family at the bedside   O:// Current vital signs: BP (!) 99/50 (BP Location: Right Arm)   Pulse 95   Temp 98.2 F (36.8 C) (Axillary)   Resp 16   Wt 77 kg   SpO2 93%  Vital signs in last 24 hours: Temp:  [97.7 F (36.5 C)-98.2 F (36.8 C)] 98.2 F (36.8 C) (06/02 0720) Pulse Rate:  [73-102] 95 (06/02 0847) Resp:  [14-32] 16 (06/02 0847) BP: (93-123)/(50-94) 99/50 (06/02 0720) SpO2:  [87 %-100 %] 93 % (06/02 0847)  GENERAL: lethargic, critically ill elderly male in NAD HEENT: - Normocephalic and atraumatic, dry mm LUNGS - Clear to auscultation bilaterally with no wheezes CV - S1S2 RRR, no m/r/g, equal pulses bilaterally. ABDOMEN - Soft, nontender, nondistended with normoactive BS Ext: bandage on right foot, warm, well perfused, intact peripheral pulses, no edema  NEURO:  Mental Status: lethargic, responds to deep painful stimuli and will open eyes. Not following commands or verbalizing Cranial Nerves: PERRL 2 mm/brisk. EOMI, visual fields blinks to threat bilaterally no facial asymmetry, facial sensation intact, hearing intact, tongue/uvula/soft palate midline, normal sternocleidomastoid and trapezius muscle strength. No evidence of tongue atrophy or fibrillations Motor: flickers to pain in bilateral uppers, withdraws to painful stimuli in bilateral lowers Tone: is normal and bulk is normal Sensation- Intact to light touch bilaterally Coordination: Unable to assess Gait- deferred  Medications  Current Facility-Administered Medications:    acetaminophen (TYLENOL) tablet 650 mg, 650 mg, Oral, Q6H PRN **OR** acetaminophen (TYLENOL) suppository 650 mg, 650 mg, Rectal, Q6H PRN, Kristopher Oppenheim, DO   amLODipine (NORVASC) tablet  5 mg, 5 mg, Oral, Daily, Ghimire, Henreitta Leber, MD, 5 mg at 11/14/21 1516   Chlorhexidine Gluconate Cloth 2 % PADS 6 each, 6 each, Topical, Q0600, Gean Quint, MD, 6 each at 11/15/21 0518   doxercalciferol (HECTOROL) injection 3 mcg, 3 mcg, Intravenous, Q T,Th,Sa-HD, Gean Quint, MD   haloperidol lactate (HALDOL) injection 4 mg, 4 mg, Intravenous, Q6H PRN, 4 mg at 11/13/21 2253 **OR** haloperidol lactate (HALDOL) injection 4 mg, 4 mg, Intramuscular, Q6H PRN, Ghimire, Henreitta Leber, MD   hydrALAZINE (APRESOLINE) injection 10 mg, 10 mg, Intravenous, Q6H PRN, Ghimire, Henreitta Leber, MD   insulin aspart (novoLOG) injection 0-5 Units, 0-5 Units, Subcutaneous, QHS, Bridgett Larsson, Eric, DO   insulin aspart (novoLOG) injection 0-6 Units, 0-6 Units, Subcutaneous, TID WC, Kristopher Oppenheim, DO, 1 Units at 10/16/2021 1659   insulin glargine-yfgn (SEMGLEE) injection 10 Units, 10 Units, Subcutaneous, QHS, Kristopher Oppenheim, DO, 10 Units at 11/14/21 2113   labetalol (NORMODYNE) injection 10 mg, 10 mg, Intravenous, Q2H PRN, Ghimire, Henreitta Leber, MD   LORazepam (ATIVAN) injection 1 mg, 1 mg, Intravenous, Q6H PRN, Ghimire, Henreitta Leber, MD   metoprolol tartrate (LOPRESSOR) tablet 50 mg, 50 mg, Oral, BID, Kristopher Oppenheim, DO, 50 mg at 11/14/21 2113   ondansetron (ZOFRAN) tablet 4 mg, 4 mg, Oral, Q6H PRN **OR** ondansetron (ZOFRAN) injection 4 mg, 4 mg, Intravenous, Q6H PRN, Kristopher Oppenheim, DO, 4 mg at 11/14/21 1339   prochlorperazine (COMPAZINE) injection 5 mg, 5 mg, Intravenous, Q6H PRN, Jonetta Osgood, MD, 5 mg at 11/14/21 1738   Imaging I have reviewed images in epic and the results pertinent to this  consultation are:  MRI examination of the brain 5/30: 1. No acute intracranial abnormality. 2. Mild chronic microvascular ischemic disease for age  rEEG 6/1: No seizures identified   Assessment:  61 year old with above past medical history presenting for sudden onset of confusion and altered mental status.  On examination he has perseveration and  inability to name objects or repeat sentences but he is able to follow some simple commands.  He has asterixis on outstretched arms.  His lab findings reveal increased BUN and creatinine-he is an ESRD patient.  Acute metabolic encephalopathy likely multifactorial due to uremia, medications and potential infections or possibly Cefepime toxicity   Recommendations: - Hold Plavix and heparin SQ for possible LP  - Consider LP if encephalopathy does not improve in a few days to r/o CNS infection  - Check Ammonia  - Agree with d/c melatonin, seraquel and haldol  - Neurology will continue to follow   Beulah Gandy DNP, [place]    Attending Neurohospitalist Addendum Patient seen and examined with APP/Resident. Agree with the history and physical as documented above. Agree with the plan as documented, which I helped formulate. I have independently reviewed the chart, obtained history, review of systems and examined the patient.I have personally reviewed pertinent head/neck/spine imaging (CT/MRI). Plan discussed with Dr. Oren Binet Please feel free to call with any questions.  -- Amie Portland, MD Neurologist [facility] Pager: [phone]

## 2021-11-15 NOTE — Progress Notes (Signed)
[facility] Progress Note    Assessment/ Plan:   ESRD:  -Outpatient orders: [place], TTS, 3 hours 45 minutes, F1 80, BFR 450/autoflow 1.5, EDW 85kg, 2K, 3Ca.  Meds: Hectorol 3 mcg q. treatment, heparin 2000 units bolus, 1000 units mid run -no indication for renal replacement therapy as of today, hopefully his hemodynamics will stabilize without further pressor needs. Will reassess tomorrow and if hemodynamically unstable, then will need to start CRRT   Acute metabolic encephalopathy -MRI without any acute abnormalities. Per primary. Possible cefepime induced neurotoxicity. Neuro on board. Per primary service. Possible sedation effect from meds?  Leukocytosis, sepsis? -on broad spec abx  Volume/ hypotension -EDW 85 kg. Intimally hypertensive on presentation -has been hypotensive with HD which seems to be a chronic issue -remains euvolemic -can consider adding midodrine -Will utilize albumin support with HD  Osteomyelitis, chronic -per primary   Anemia of Chronic Kidney Disease:  -Hemoglobin 12.5, not receiving any iron or ESA's as an outpatient.  Monitor for now.     Secondary Hyperparathyroidism/Hyperphosphatemia: Resume home sevelamer, Sensipar. Hectorol resumed  DM2 -Management per primary service    Subjective:   Rapid response earlier today for hypotension, decreased mental status. Received 1L of fluids, required pressors briefly, now off. Now in ICU. He is now DNR. S/p HD yesterday, net uf 0.7L   Objective:   BP 119/62   Pulse 81   Temp 98.7 F (37.1 C) (Axillary)   Resp (!) 27   Wt 77 kg   SpO2 95%   Intake/Output Summary (Last 24 hours) at 11/15/2021 1402 Last data filed at 11/15/2021 1300 Gross per 24 hour  Intake 796.26 ml  Output 0 ml  Net 796.26 ml   Weight change:   Physical Exam: Gen: somnolent CVS:rrr, s1s2 Resp:cta bl DEY:CXKG, nt Ext: somnolent  Dialysis access: LUE AVF +b/t  Imaging: DG Chest 1 View  Result Date:  11/14/2021 CLINICAL DATA:  Leukocytosis and vomiting. EXAM: CHEST  1 VIEW COMPARISON:  None Available. FINDINGS: The heart size and mediastinal contours are within normal limits. Both lungs are clear. Multilevel degenerative changes are seen throughout the thoracic spine. IMPRESSION: No active cardiopulmonary disease. Electronically Signed   By: Virgina Norfolk M.D.   On: 11/14/2021 19:07   CT ABDOMEN PELVIS W CONTRAST  Result Date: 11/15/2021 CLINICAL DATA:  Nausea and vomiting.  On dialysis. EXAM: CT ABDOMEN AND PELVIS WITH CONTRAST TECHNIQUE: Multidetector CT imaging of the abdomen and pelvis was performed using the standard protocol following bolus administration of intravenous contrast. RADIATION DOSE REDUCTION: This exam was performed according to the departmental dose-optimization program which includes automated exposure control, adjustment of the mA and/or kV according to patient size and/or use of iterative reconstruction technique. CONTRAST:  77mL OMNIPAQUE IOHEXOL 300 MG/ML  SOLN COMPARISON:  None Available. FINDINGS: Lower chest: No acute abnormality. Hepatobiliary: No focal liver abnormality is seen. No gallstones, gallbladder wall thickening, or biliary dilatation. Pancreas: Unremarkable. No pancreatic ductal dilatation or surrounding inflammatory changes. Spleen: Normal in size without focal abnormality. Adrenals/Urinary Tract: There is bilateral renal atrophy. There is no hydronephrosis. Adrenal glands are within normal limits. There is mild diffuse bladder wall thickening with mild surrounding inflammatory stranding. Stomach/Bowel: There is a large air-fluid level in the stomach. There is some fluid distention of the distal esophagus and distal esophageal wall thickening. Appendix appears normal. No evidence of bowel wall thickening, distention, or inflammatory changes. Vascular/Lymphatic: Aortic atherosclerosis. No enlarged abdominal or pelvic lymph nodes. There are mildly  enlarged right  inguinal lymph nodes measuring up to 11 mm short axis. Reproductive: Prostate is unremarkable. Other: No abdominal wall hernia or abnormality. No abdominopelvic ascites. There is a 1.2 x 3.8 x 2.6 cm nonenhancing low attenuation area in the right inguinal region/proximal anterior thigh. This is incompletely imaged. Degenerative changes affect the spine. Musculoskeletal: No acute or significant osseous findings. IMPRESSION: 1. Mild bladder wall thickening and inflammation concerning for cystitis. 2. Esophageal wall thickening and inflammation worrisome for esophagitis. There is fluid in the distal esophagus compatible with gastroesophageal reflux. 3. Large air-fluid level noted in the stomach. Please correlate clinically for gastroparesis or gastric outlet obstruction. 4. Low-attenuation area in the subcutaneous tissues of the right inguinal region/proximal anterior thigh measuring 1.2 x 3.8 x 2.6 cm. Findings are indeterminate and may represent a complex fluid collection or other soft tissue mass. There are mildly enlarged right inguinal lymph nodes. Recommend clinical correlation and follow-up. Electronically Signed   By: Ronney Asters M.D.   On: 11/15/2021 02:14   DG Chest Port 1V same Day  Result Date: 11/15/2021 CLINICAL DATA:  Shortness of breath. EXAM: PORTABLE CHEST 1 VIEW COMPARISON:  Chest x-ray from yesterday. FINDINGS: The heart size and mediastinal contours are within normal limits. Both lungs are clear. The visualized skeletal structures are unremarkable. IMPRESSION: No active disease. Electronically Signed   By: Titus Dubin M.D.   On: 11/15/2021 09:03   DG Abd Portable 1V  Result Date: 11/14/2021 CLINICAL DATA:  Leukocytosis, vomiting. EXAM: PORTABLE ABDOMEN - 1 VIEW COMPARISON:  None Available. FINDINGS: The bowel gas pattern is normal. Surgical clips overlie the abdomen. Vascular/seminal vesicle calcifications. Levoconvex curvature of the lumbar spine with associated degenerative change.  IMPRESSION: Nonobstructive bowel gas pattern. Electronically Signed   By: Dahlia Bailiff M.D.   On: 11/14/2021 18:11   EEG adult  Result Date: 11/15/2021 Lora Havens, MD     11/15/2021  8:26 AM Patient Name: Henry Phillips MRN: [id number] Epilepsy Attending: Lora Havens Referring Physician/Provider: Amie Portland, MD Date: 11/14/2021 Duration: 24.52 mins Patient history: 61 year old male with sudden onset confusion and altered mental status.  EEG to evaluate for seizure. Level of alertness:  lethargic AEDs during EEG study: None Technical aspects: This EEG study was done with scalp electrodes positioned according to the 10-20 International system of electrode placement. Electrical activity was acquired at a sampling rate of 500Hz  and reviewed with a high frequency filter of 70Hz  and a low frequency filter of 1Hz . EEG data were recorded continuously and digitally stored. Description: No clear posterior dominant rhythm was seen.  EEG showed continuous generalized polymorphic mixed frequencies with predominantly 5 to 8 Hz theta-alpha activity admixed with intermittent generalized 2 to 3 Hz delta slowing.  Hyperventilation and photic stimulation were not performed.   ABNORMALITY - Continuous slow, generalized IMPRESSION: This study is suggestive of moderate diffuse encephalopathy, nonspecific etiology. No seizures or epileptiform discharges were seen throughout the recording. [place]: BMET Recent Labs  Lab 10/15/2021 0038 11/13/21 0224 11/14/21 0051 11/15/21 0238  NA 141 141 140 139  K 4.4 4.0 3.5 3.8  CL 102 104 97* 92*  CO2 24 20* 25 21*  GLUCOSE 212* 173* 82 140*  BUN 44* 53* 29* 30*  CREATININE 8.40* 10.18* 6.68* 5.87*  CALCIUM 9.8 9.9 9.7 9.7  PHOS  --  4.6  --   --    CBC Recent Labs  Lab 11/09/2021 0038 11/13/21 0224 11/14/21 0051 11/15/21  0238  WBC 12.8* 12.0* 15.8* 30.2*  NEUTROABS 9.2*  --   --  26.4*  HGB 11.5* 11.1* 11.8* 12.5*  HCT 33.4* 33.0* 36.0*  38.0*  MCV 91.5 92.2 92.5 92.9  PLT 205 198 231 214    Medications:     Chlorhexidine Gluconate Cloth  6 each Topical Q0600   doxercalciferol  3 mcg Intravenous Q T,Th,Sa-HD   insulin aspart  0-5 Units Subcutaneous QHS   insulin aspart  0-6 Units Subcutaneous TID WC   insulin glargine-yfgn  10 Units Subcutaneous QHS   mouth rinse  15 mL Mouth Rinse BID   vancomycin variable dose per unstable renal function (pharmacist dosing)   Does not apply See admin instructions      Gean Quint, MD [facility] 11/15/2021, 2:02 PM

## 2021-11-15 NOTE — Procedures (Signed)
Intubation Procedure Note  Henry Phillips  [id number]  Jan 21, 1961  Date:11/15/21  Time:9:36 PM   Provider Performing:Ezri Landers [name]    Procedure: Intubation (31500)  Indication(s) Respiratory Failure  Consent Risks of the procedure as well as the alternatives and risks of each were explained to the patient and/or caregiver.  Consent for the procedure was obtained and is signed in the bedside chart  Discussed with patient.  Dr. Loanne Drilling also discussed with sister [name].    Anesthesia Etomidate, Fentanyl, and Rocuronium   Time Out Verified patient identification, verified procedure, site/side was marked, verified correct patient position, special equipment/implants available, medications/allergies/relevant history reviewed, required imaging and test results available.   Sterile Technique Usual hand hygeine, masks, and gloves were used   Procedure Description Patient positioned in bed supine.  Sedation given as noted above.  Patient was intubated with endotracheal tube using Glidescope.  View was Grade 1 full glottis .  Number of attempts was 1.  Colorimetric CO2 detector was consistent with tracheal placement. Emesis noted in posterior oropharynx.  Intubated, balloon inflated and suctioned.    Complications/Tolerance Patient was satting poorly (low 70s) prior to intubation despite all attempts to oxygenate, incuding bvm.   Sat dropped further, bp dropped and patient had cardiac arrest, followed by 1 round of CPR then rosc.    Chest X-ray is ordered to verify placement. Breath sounds symmetric to me.   Rhonchorus.    EBL None    Specimen(s) None

## 2021-11-15 NOTE — Progress Notes (Signed)
Cardiac arrest.  Post intubation.  PEA I was palpating pulse for hypotension when pulses lost.   Initiated CPR, 1 mg epinephrine.   1 amp bicarb  ROSC achieved.  Levophed held temporarily, HTN from Epi.

## 2021-11-15 NOTE — Progress Notes (Signed)
RT notified by primary RN that pt has had periods of desaturation this morning. O2 had already been increased from 2L-4L prior to RT assessing. Pt is sleeping, mouth open, no obvious distress noted at time of RT's assessment. Pt does have some scattered crackles, and is tachypneic around 32bpm. O2 increased to 6L, SpO2 around 90%. Advised RN to notify for RT again if SpO2 drops again to low 80's maintains. RT will monitor as needed.

## 2021-11-15 NOTE — Procedures (Signed)
Patient Name: Jordanny Waddington  MRN: [id number]  Epilepsy Attending: Lora Havens  Referring Physician/Provider: Amie Portland, MD Date: 11/14/2021 Duration: 24.52 mins  Patient history: 61 year old male with sudden onset confusion and altered mental status.  EEG to evaluate for seizure.  Level of alertness:  lethargic   AEDs during EEG study: None  Technical aspects: This EEG study was done with scalp electrodes positioned according to the 10-20 International system of electrode placement. Electrical activity was acquired at a sampling rate of 500Hz  and reviewed with a high frequency filter of 70Hz  and a low frequency filter of 1Hz . EEG data were recorded continuously and digitally stored.   Description: No clear posterior dominant rhythm was seen.  EEG showed continuous generalized polymorphic mixed frequencies with predominantly 5 to 8 Hz theta-alpha activity admixed with intermittent generalized 2 to 3 Hz delta slowing.  Hyperventilation and photic stimulation were not performed.     ABNORMALITY - Continuous slow, generalized  IMPRESSION: This study is suggestive of moderate diffuse encephalopathy, nonspecific etiology. No seizures or epileptiform discharges were seen throughout the recording.  Emma Schupp Barbra Sarks

## 2021-11-15 NOTE — CV Procedure (Signed)
Central Venous Catheter Insertion Procedure Note  Henry Phillips  [id number]  May 19, 1961  Date:11/15/21  Time:11:44 PM   Provider Performing:Natalie Leclaire [name]   Procedure: Insertion of Non-tunneled Central Venous Catheter(36556) with US guidance (45859)   Indication(s) Medication administration  Consent Unable to obtain consent due to emergent nature of procedure.  Anesthesia Topical only with 1% lidocaine   Timeout Verified patient identification, verified procedure, site/side was marked, verified correct patient position, special equipment/implants available, medications/allergies/relevant history reviewed, required imaging and test results available.  Sterile Technique Maximal sterile technique including full sterile barrier drape, hand hygiene, sterile gown, sterile gloves, mask, hair covering, sterile ultrasound probe cover (if used).  Procedure Description Area of catheter insertion was cleaned with chlorhexidine and draped in sterile fashion.  With real-time ultrasound guidance a central venous catheter was placed into the left internal jugular vein. Nonpulsatile blood flow and easy flushing noted in all ports.  The catheter was sutured in place and sterile dressing applied.  L IJ was normal appearing.  RIJ small.    Complications/Tolerance None; patient tolerated the procedure well. Chest X-ray is ordered to verify placement for internal jugular or subclavian cannulation.   Chest x-ray is not ordered for femoral cannulation.  EBL Minimal  Specimen(s) None

## 2021-11-15 NOTE — Progress Notes (Signed)
Pharmacy Antibiotic Note  Dash Cardarelli is a 61 y.o. male admitted on 10/22/2021 with concern for possible meningitis. Pharmacy has been consulted for vancomycin dosing. Noted patient is ESRD. Last dose of vancomycin 5/31 at 1330 at the end of dialysis.  Current Vancomycin level is 23 which is within pre-HD goal.   Plan: Hold further vancomycin doses until patient has next dialysis session If patient tolerates a full HD session, could resume Vancomycin 750 mg post dialysis.  F/U future HD sessions , goals of care, clinical improvement   Weight: 77 kg (169 lb 12.1 oz)  Temp (24hrs), Avg:97.9 F (36.6 C), Min:97.7 F (36.5 C), Max:98.2 F (36.8 C)  Recent Labs  Lab 10/22/2021 0038 11/13/21 0224 11/14/21 0051 11/15/21 0238  WBC 12.8* 12.0* 15.8* 30.2*  CREATININE 8.40* 10.18* 6.68* 5.87*    CrCl cannot be calculated (Unknown ideal weight.).    No Known Allergies    Thank you for allowing pharmacy to be a part of this patient's care.  Jimmy Footman, PharmD, BCPS, BCIDP Infectious Diseases Clinical Pharmacist Phone: [phone] 11/15/2021 12:06 PM

## 2021-11-15 NOTE — Consult Note (Signed)
NAME:  Henry Phillips, MRN:  [id number], DOB:  03/07/1961, LOS: 3 ADMISSION DATE:  10/18/2021, CONSULTATION DATE: 11/15/2021 REFERRING MD: [facility]ad, CHIEF COMPLAINT: Altered mental status hypotension chronic osteomyelitis  History of Present Illness:  61 year old male with chronic osteomyelitis of the pelvis region who underwent 6 weeks of antimicrobial therapy via PICC line approximately 6 months ago.  He has end-stage renal disease chronic hypertension diabetes mellitus insulin-dependent with rising white count and decreasing mental status although he has been getting Haldol and Seroquel for agitation.  He has been made a DNR on 11/15/2021 but family has changed their mind and want low-dose pressors and a stent in ICU to see if he may get better.  He is transferring to the intensive care unit on 11/15/2021 fluid resuscitation along with low-dose peripheral vasopressors.  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 rate (!) 30, weight 77 kg, SpO2 94 %.        Intake/Output Summary (Last 24 hours) at 11/15/2021 1135 Last data filed at 11/14/2021 1516 Gross per 24 hour  Intake 20 ml  Output --  Net 20 ml   Filed Weights   11/13/21 0400 11/13/21 1125 11/13/21 1717  Weight: 79.3 kg 79.7 kg 77 kg    Examination: General: Ill-appearing male poorly responsive HENT: Oral mucosa is extremely dry no  JVD is appreciated Lungs: Diminished breath sounds in the bases Cardiovascular: Heart sounds are distant Abdomen: Soft nontender positive bowel sounds Extremities: Right lower extremity with transmetatarsal amputation with purulent drainage Neuro: Grimaces and withdraws to noxious stimuli  Resolved Hospital Problem list     Assessment & Plan:  Hypotension in the setting of altered mental status, rising WBC, with multiple comorbidities currently a DNR.  He has had repeated doses of Haldol Seroquel and Ativan.  He is end-stage renal disease.  Noted to have chronic osteomyelitis with a transmetatarsal amputation on right.  Family is decided that short-term vasopressors and ICU admission would be okay for at least a week to see if he gets better. Transfer to intensive care unit Fluid resuscitation Stop sedating Stop antihypertensives Peripheral Levophed if needed Consider CT of the head   Presumed sepsis with white count 30,000 Infectious disease has him on vancomycin and cefepime Check procalcitonin  Altered mental status difficult to assess at this time.  Has been sedated with multiple agents Transferred to intensive care unit Consider holding sedation Questionable CT of head  End-stage renal disease AV graft right Dialyze as needed May need temporary HD catheter Nephrology is following  Diabetes mellitus CBG (last 3)  Recent Labs    11/14/21 1649 11/14/21 2048 11/15/21 0853  GLUCAP 108* 130* 191*   Sliding-scale insulin protocol  Normally hypertensive Stop antihypertensives  Goals of care Ongoing discussion with family   Best Practice (right click and "Reselect all SmartList Selections" daily)   Diet/type: NPO  DVT prophylaxis:  GI prophylaxis: PPI Lines: N/A Foley:  N/A Code Status:  DNR Last date of multidisciplinary goals of care discussion [tbd]  Labs   CBC: Recent Labs  Lab 10/31/2021 0038 11/13/21 0224 11/14/21 0051 11/15/21 0238  WBC 12.8*  12.0* 15.8* 30.2*  NEUTROABS 9.2*  --   --  26.4*  HGB 11.5* 11.1* 11.8* 12.5*  HCT 33.4* 33.0* 36.0* 38.0*  MCV 91.5 92.2 92.5 92.9  PLT 205 198 231 027    Basic Metabolic Panel: Recent Labs  Lab 10/14/2021 0038 11/13/21 0224 11/14/21 0051 11/15/21 0238  NA 141 141 140 139  K 4.4 4.0 3.5 3.8  CL 102 104 97* 92*  CO2 24 20* 25 21*  GLUCOSE 212* 173* 82 140*  BUN 44* 53* 29* 30*  CREATININE 8.40* 10.18* 6.68* 5.87*  CALCIUM 9.8 9.9 9.7 9.7  MG 2.1  --   --   --   PHOS  --  4.6  --   --    GFR: CrCl cannot be calculated (Unknown ideal weight.). Recent Labs  Lab 10/21/2021 0038 11/13/21 0224 11/14/21 0051 11/15/21 0238  WBC 12.8* 12.0* 15.8* 30.2*    Liver Function Tests: Recent Labs  Lab 11/13/2021 0038 11/13/21 0224 11/14/21 0051 11/15/21 0238  AST 16  --  29 20  ALT 13  --  16 16  ALKPHOS 33*  --  35* 34*  BILITOT 0.7  --  1.4* 1.9*  PROT 7.7  --  8.3* 7.5  ALBUMIN 3.3* 3.1* 3.5 3.4*   Recent Labs  Lab 11/14/21 0051  LIPASE 82*   Recent Labs  Lab 11/13/21 0614  AMMONIA 26    ABG    Component Value Date/Time   PHART 7.47 (H) 11/15/2021 0838   PCO2ART 28 (L) 11/15/2021 0838   PO2ART 58 (L) 11/15/2021 0838   HCO3 20.4 11/15/2021 0838   ACIDBASEDEF 2.0 11/15/2021 0838   O2SAT 89.3 11/15/2021 0838     Coagulation Profile: No results for input(s): INR, PROTIME in the last 168 hours.  Cardiac Enzymes: No results for input(s): CKTOTAL, CKMB, CKMBINDEX, TROPONINI in the last 168 hours.  HbA1C: Hgb A1c MFr Bld  Date/Time Value Ref Range Status  10/18/2021 12:38 AM 6.5 (H) 4.8 - 5.6 % Final    Comment:    (NOTE) Pre diabetes:          5.7%-6.4%  Diabetes:              >6.4%  Glycemic control for   <7.0% adults with diabetes     CBG: Recent Labs  Lab 11/14/21 0821 11/14/21 1342 11/14/21 1649 11/14/21 2048 11/15/21 0853  GLUCAP 81 93 108* 130* 191*    Review of Systems:   na  Past Medical History:  He,  has a past medical  history of Chronic osteomyelitis of pelvic region ([place]), ESRD (end stage renal disease) on dialysis ([place]), Essential hypertension, Necrotizing fasciitis ([place]), and 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   Surgical History:   Past Surgical History:  Procedure Laterality Date   AV FISTULA PLACEMENT     left radiocephalic   TRANSMETATARSAL AMPUTATION Right      Social History:   reports that he has quit smoking. His smoking use included cigarettes. He has a 10.50 pack-year smoking history. He has never used smokeless tobacco. He reports that he does not currently use alcohol. He reports that he does not use drugs.   Family History:  His family history includes Diabetes in his brother and sister; Heart disease in his father; Hypertension in his father; Stroke in his paternal grandmother.   Allergies No Known Allergies   Home Medications  Prior to Admission medications   Medication Sig Start Date End Date Taking? Authorizing Provider  amLODipine (NORVASC) 10 MG tablet Take 10 mg by mouth daily. 08/06/21  Yes [provider]  Calcium Carbonate Antacid (TUMS PO) Take 1 tablet by mouth 3 (three) times daily.   Yes [provider]  Cholecalciferol (VITAMIN D3) 50 MCG (2000 UT) TABS Take 2,000 Units by mouth daily.   Yes [provider]  cinacalcet (SENSIPAR) 60 MG tablet Take 60 mg by mouth daily. 08/29/21  Yes [provider]  clopidogrel (PLAVIX) 75 MG tablet Take 75 mg by mouth daily.   Yes [provider]  HYDROcodone-acetaminophen (NORCO) 10-325 MG tablet Take 1 tablet by mouth 4 (four) times daily. 10/30/21  Yes [provider]  insulin glargine (LANTUS) 100 unit/mL SOPN Inject 20 Units into the skin at bedtime.   Yes [provider]  metoprolol tartrate (LOPRESSOR) 100 MG tablet Take 50 mg by mouth 2 (two) times daily. 08/06/21  Yes [provider]  sevelamer (RENAGEL) 800 MG tablet Take 2,400 mg by  mouth 3 (three) times daily with meals. 10/09/21  Yes [provider]  atorvastatin (LIPITOR) 40 MG tablet Take 40 mg by mouth daily. Patient not taking: Reported on 10/19/2021    [provider]  Doxercalciferol ([place] IV) Per nephrologist. Give at dialysis center 10/10/21 10/09/22  [provider]     Critical care time: 53 min   Richardson Landry Aarianna Hoadley ACNP Acute Care Nurse Practitioner [place] Please consult Amion 11/15/2021, 11:36 AM

## 2021-11-15 NOTE — Progress Notes (Signed)
PT Cancellation Note  Patient Details Name: Henry Phillips MRN: [id number] DOB: 1961-03-09   Cancelled Treatment:    Reason Eval/Treat Not Completed: Medical issues which prohibited therapy. Pt with declining medical status. Will follow up next week.    Shary Decamp [facility] 11/15/2021, 10:52 AM [place] Office [phone]

## 2021-11-15 NOTE — Progress Notes (Signed)
Patient's O2 sats maintaining in the low 80s despite increasing O2. Patient is sleeping and mouth breathing. Rhonchi noted in upper lobes. RT notified.

## 2021-11-15 NOTE — Progress Notes (Signed)
eLink Physician-Brief Progress Note Patient Name: Henry Phillips DOB: Sep 04, 1960 MRN: [id number]   Date of Service  11/15/2021  HPI/Events of Note  Patient in respiratory distress with desaturation from 7L to NRB. Sats remain in the mid 70s. Also became hypotensive to the 80s. Levophed restarted at 4 mcg/h  DNR status  Patient rhodochrous  with weak cough  CTA neg PE but noted fluid/debris in several right lower lobe bronchi  eICU Interventions  NTS Heated high flow with NRB Avoid BiPAP due to concern for aspiration and too weak to self-remove mask Bedside has called family. No answer. Left VM     Intervention Category Intermediate Interventions: Respiratory distress - evaluation and management  Henry Phillips 11/15/2021, 8:28 PM

## 2021-11-15 NOTE — Significant Event (Signed)
Rapid Response Event Note   Reason for Call :  Asked to evaluate patient as second set of eyes. Decreased LOC Cold and diaphoretic  Initial Focused Assessment:  Pt lying in bed. Minimal, brief eye opening to noxious stimuli. PERRLA, 60mm. Lung sounds having faint inspiratory and expiratory wheeze noted to the right lung. Rhonchus and expiratory wheeze noted to left lung. Skin is cold, diaphoretic, pale. No obvious signs of fluid overload. Peripheral pulses 1+.  VS: T 98.21F, BP 78/48, HR 90, RR 34, SpO2 93% on 7L HFNC  Interventions:  -250 IVF bolus over 1 hour- pt with ESRD -Per Dr. Tacy Learn, give 1L total IVF bolus  Plan of Care:  -Transfer to ICU for vasopressors  Event Summary:  MD Notified: Dr. Sloan Leiter Arrival Time: 1610 End Time: [place], RN

## 2021-11-15 NOTE — Progress Notes (Addendum)
eLink Physician-Brief Progress Note Patient Name: Henry Phillips DOB: July 06, 1960 MRN: [id number]   Date of Service  11/15/2021  HPI/Events of Note  Family called back. Updated by RN regarding worsening respiratory status. Requested to speak to physician.  I updated sister. She requested to reverse code status and have him intubated if indicated.  eICU Interventions  Code status changed to Full scope [place] team called to evaluate for intubation     Intervention Category Intermediate Interventions: Respiratory distress - evaluation and management  [name] 11/15/2021, 8:57 PM

## 2021-11-15 NOTE — TOC Progression Note (Signed)
Transition of Care [facility]) - Initial/Assessment Note    Patient Details  Name: Henry Phillips MRN: [id number] Date of Birth: 11-May-1961  [facility] of [facility]) CM/SW Contact:    Milinda Antis, [place] Phone Number: 11/15/2021, 12:49 PM  Clinical Narrative:                 Patient transferred to ICU from 5W for vasopressors.  TOC following patient for any d/c planning needs once medically stable.  Lind Covert, MSW, LCSWA   Expected Discharge Plan: [place] Barriers to Discharge: Continued Medical Work up   Patient Goals and CMS Choice Patient states their goals for this hospitalization and ongoing recovery are:: patient unable to conversate CMS Medicare.gov Compare Post Acute Care list provided to:: Other (Comment Required) Choice offered to / list presented to : Sibling (sister [place])  Expected Discharge Plan and Services Expected Discharge Plan: [place]   Discharge Planning Services: CM Consult   Living arrangements for the past 2 months: Single Family Home                                      Prior Living Arrangements/Services Living arrangements for the past 2 months: Single Family Home Lives with:: Siblings                   Activities of Daily Living      Permission Sought/Granted                  Emotional Assessment              Admission diagnosis:  Malignant hypertension [I10] Acute encephalopathy [G93.40] Patient Active Problem List   Diagnosis Date Noted   Malignant hypertension 11/13/2021   Hypertensive encephalopathy 10/16/2021   Acute encephalopathy 10/28/2021   Osteomyelitis, unspecified ([place]) 10/08/2021   Peripheral vascular disease ([place]) 07/15/2021   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

## 2021-11-16 ENCOUNTER — Inpatient Hospital Stay (HOSPITAL_COMMUNITY): Payer: Medicaid Other

## 2021-11-16 DIAGNOSIS — Z515 Encounter for palliative care: Secondary | ICD-10-CM

## 2021-11-16 DIAGNOSIS — I10 Essential (primary) hypertension: Secondary | ICD-10-CM | POA: Diagnosis not present

## 2021-11-16 DIAGNOSIS — Z7189 Other specified counseling: Secondary | ICD-10-CM | POA: Diagnosis not present

## 2021-11-16 DIAGNOSIS — I469 Cardiac arrest, cause unspecified: Secondary | ICD-10-CM

## 2021-11-16 LAB — LACTIC ACID, PLASMA: Lactic Acid, Venous: 2.4 mmol/L (ref 0.5–1.9)

## 2021-11-16 LAB — GLUCOSE, CAPILLARY
Glucose-Capillary: 265 mg/dL — ABNORMAL HIGH (ref 70–99)
Glucose-Capillary: 221 mg/dL — ABNORMAL HIGH (ref 70–99)
Glucose-Capillary: 328 mg/dL — ABNORMAL HIGH (ref 70–99)
Glucose-Capillary: 229 mg/dL — ABNORMAL HIGH (ref 70–99)
Glucose-Capillary: 368 mg/dL — ABNORMAL HIGH (ref 70–99)
Glucose-Capillary: 324 mg/dL — ABNORMAL HIGH (ref 70–99)
Glucose-Capillary: 365 mg/dL — ABNORMAL HIGH (ref 70–99)
Glucose-Capillary: 308 mg/dL — ABNORMAL HIGH (ref 70–99)

## 2021-11-16 LAB — COMPREHENSIVE METABOLIC PANEL
ALT: 18 U/L (ref 0–44)
AST: 23 U/L (ref 15–41)
Albumin: 2.7 g/dL — ABNORMAL LOW (ref 3.5–5.0)
Alkaline Phosphatase: 36 U/L — ABNORMAL LOW (ref 38–126)
Anion gap: 22 — ABNORMAL HIGH (ref 5–15)
BUN: 64 mg/dL — ABNORMAL HIGH (ref 8–23)
CO2: 18 mmol/L — ABNORMAL LOW (ref 22–32)
Calcium: 8.7 mg/dL — ABNORMAL LOW (ref 8.9–10.3)
Chloride: 96 mmol/L — ABNORMAL LOW (ref 98–111)
Creatinine, Ser: 7.93 mg/dL — ABNORMAL HIGH (ref 0.61–1.24)
GFR, Estimated: 7 mL/min — ABNORMAL LOW (ref >60)
Glucose, Bld: 334 mg/dL — ABNORMAL HIGH (ref 70–99)
Potassium: 4.5 mmol/L (ref 3.5–5.1)
Sodium: 136 mmol/L (ref 135–145)
Total Bilirubin: 0.8 mg/dL (ref 0.3–1.2)
Total Protein: 6.8 g/dL (ref 6.5–8.1)

## 2021-11-16 LAB — POCT I-STAT 7, (LYTES, BLD GAS, ICA,H+H)
Acid-base deficit: 5 mmol/L — ABNORMAL HIGH (ref 0.0–2.0)
Bicarbonate: 19.8 mmol/L — ABNORMAL LOW (ref 20.0–28.0)
Calcium, Ion: 1.1 mmol/L — ABNORMAL LOW (ref 1.15–1.40)
HCT: 32 % — ABNORMAL LOW (ref 39.0–52.0)
Hemoglobin: 10.9 g/dL — ABNORMAL LOW (ref 13.0–17.0)
O2 Saturation: 95 %
Patient temperature: 97.8
Potassium: 4.3 mmol/L (ref 3.5–5.1)
Sodium: 134 mmol/L — ABNORMAL LOW (ref 135–145)
TCO2: 21 mmol/L — ABNORMAL LOW (ref 22–32)
pCO2 arterial: 32.9 mmHg (ref 32–48)
pH, Arterial: 7.385 (ref 7.35–7.45)
pO2, Arterial: 75 mmHg — ABNORMAL LOW (ref 83–108)

## 2021-11-16 LAB — CBC
HCT: 32.1 % — ABNORMAL LOW (ref 39.0–52.0)
Hemoglobin: 11.1 g/dL — ABNORMAL LOW (ref 13.0–17.0)
MCH: 31.4 pg (ref 26.0–34.0)
MCHC: 34.6 g/dL (ref 30.0–36.0)
MCV: 90.9 fL (ref 80.0–100.0)
Platelets: 330 10*3/uL (ref 150–400)
RBC: 3.53 MIL/uL — ABNORMAL LOW (ref 4.22–5.81)
RDW: 14.9 % (ref 11.5–15.5)
WBC: 37.3 10*3/uL — ABNORMAL HIGH (ref 4.0–10.5)
nRBC: 0.1 % (ref 0.0–0.2)

## 2021-11-16 LAB — TROPONIN I (HIGH SENSITIVITY)
Troponin I (High Sensitivity): 46 ng/L — ABNORMAL HIGH (ref <18)
Troponin I (High Sensitivity): 51 ng/L — ABNORMAL HIGH (ref <18)

## 2021-11-16 LAB — ECHOCARDIOGRAM COMPLETE
Area-P 1/2: 2.1 cm2
Height: 67 in
S' Lateral: 2.7 cm
Weight: 2592.61 oz

## 2021-11-16 LAB — COOXEMETRY PANEL
Carboxyhemoglobin: 1.8 % — ABNORMAL HIGH (ref 0.5–1.5)
Methemoglobin: <0.7 % (ref 0.0–1.5)
O2 Saturation: 84.1 %
Total hemoglobin: 10.9 g/dL — ABNORMAL LOW (ref 12.0–16.0)

## 2021-11-16 LAB — AMMONIA: Ammonia: 28 umol/L (ref 9–35)

## 2021-11-16 LAB — MAGNESIUM: Magnesium: 1.9 mg/dL (ref 1.7–2.4)

## 2021-11-16 LAB — PHOSPHORUS: Phosphorus: 8.8 mg/dL — ABNORMAL HIGH (ref 2.5–4.6)

## 2021-11-16 MED ORDER — INSULIN ASPART 100 UNIT/ML IJ SOLN: 0.0000 [IU] | INTRAMUSCULAR | Status: DC

## 2021-11-16 MED ORDER — MIDAZOLAM HCL 2 MG/2ML IJ SOLN
2.0000 mg | Freq: Once | INTRAMUSCULAR | Status: AC
  Administered 2021-11-16: 2 mg via INTRAVENOUS

## 2021-11-16 MED ORDER — FENTANYL 2500MCG IN NS 250ML (10MCG/ML) PREMIX INFUSION
0.0000 ug/h | INTRAVENOUS | Status: DC
  Administered 2021-11-16: 25 ug/h via INTRAVENOUS

## 2021-11-16 MED ORDER — ONDANSETRON 4 MG PO TBDP: 4.0000 mg | ORAL_TABLET | Freq: Four times a day (QID) | ORAL | Status: DC | PRN

## 2021-11-16 MED ORDER — POLYVINYL ALCOHOL 1.4 % OP SOLN: 1.0000 [drp] | Freq: Four times a day (QID) | OPHTHALMIC | Status: DC | PRN

## 2021-11-16 MED ORDER — DEXMEDETOMIDINE HCL IN NACL 400 MCG/100ML IV SOLN
0.4000 ug/kg/h | INTRAVENOUS | Status: DC
  Administered 2021-11-16: 0.4 ug/kg/h via INTRAVENOUS
  Administered 2021-11-16: 0.7 ug/kg/h via INTRAVENOUS
  Filled 2021-11-16 (×2): qty 100

## 2021-11-16 MED ORDER — GLYCOPYRROLATE 0.2 MG/ML IJ SOLN
0.4000 mg | INTRAMUSCULAR | Status: DC
  Administered 2021-11-16 (×2): 0.4 mg via INTRAVENOUS
  Filled 2021-11-16 (×2): qty 2

## 2021-11-16 MED ORDER — HYDROCORTISONE SOD SUC (PF) 100 MG IJ SOLR
100.0000 mg | Freq: Two times a day (BID) | INTRAMUSCULAR | Status: DC
  Administered 2021-11-16: 100 mg via INTRAVENOUS
  Filled 2021-11-16 (×2): qty 2

## 2021-11-16 MED ORDER — NOREPINEPHRINE 16 MG/250ML-% IV SOLN
0.0000 ug/min | INTRAVENOUS | Status: DC
  Administered 2021-11-16: 20 ug/min via INTRAVENOUS
  Filled 2021-11-16: qty 250

## 2021-11-16 MED ORDER — PERFLUTREN LIPID MICROSPHERE
1.0000 mL | INTRAVENOUS | Status: DC | PRN
  Administered 2021-11-16: 3 mL via INTRAVENOUS

## 2021-11-16 MED ORDER — INSULIN ASPART 100 UNIT/ML IJ SOLN
0.0000 [IU] | INTRAMUSCULAR | Status: DC
  Administered 2021-11-16: 4 [IU] via SUBCUTANEOUS
  Administered 2021-11-16 (×2): 5 [IU] via SUBCUTANEOUS
  Administered 2021-11-16: 3 [IU] via SUBCUTANEOUS

## 2021-11-16 MED ORDER — BIOTENE DRY MOUTH MT LIQD: 15.0000 mL | OROMUCOSAL | Status: DC | PRN

## 2021-11-16 MED ORDER — ONDANSETRON HCL 4 MG/2ML IJ SOLN: 4.0000 mg | Freq: Four times a day (QID) | INTRAMUSCULAR | Status: DC | PRN

## 2021-11-16 MED ORDER — FENTANYL BOLUS VIA INFUSION
50.0000 ug | INTRAVENOUS | Status: DC | PRN
  Administered 2021-11-16 (×2): 50 ug via INTRAVENOUS

## 2021-11-16 MED ORDER — SODIUM CHLORIDE 0.9 % IV SOLN: 1.0000 g | INTRAVENOUS | Status: DC

## 2021-11-16 MED ORDER — SODIUM CHLORIDE 0.9 % IV SOLN
1.0000 g | Freq: Once | INTRAVENOUS | Status: AC
  Administered 2021-11-16: 1 g via INTRAVENOUS
  Filled 2021-11-16: qty 20

## 2021-11-16 MED ORDER — FENTANYL 2500MCG IN NS 250ML (10MCG/ML) PREMIX INFUSION
2.0000 ug/h | INTRAVENOUS | Status: DC
  Filled 2021-11-16: qty 250

## 2021-11-16 MED ORDER — MIDAZOLAM-SODIUM CHLORIDE 100-0.9 MG/100ML-% IV SOLN
0.5000 mg/h | INTRAVENOUS | Status: DC
  Administered 2021-11-16: 1 mg/h via INTRAVENOUS
  Filled 2021-11-16: qty 100

## 2021-11-18 LAB — CULTURE, RESPIRATORY W GRAM STAIN
Culture: NORMAL
Gram Stain: NONE SEEN

## 2021-11-19 LAB — CULTURE, BLOOD (ROUTINE X 2)
Culture: NO GROWTH
Culture: NO GROWTH
Special Requests: ADEQUATE
Special Requests: ADEQUATE

## 2021-11-20 LAB — CULTURE, BLOOD (ROUTINE X 2)
Culture: NO GROWTH
Culture: NO GROWTH
Special Requests: ADEQUATE

## 2021-12-05 MED FILL — Medication: Qty: 1 | Status: AC

## 2021-12-14 NOTE — Progress Notes (Signed)
Patient's time of death is 2045. Ronalee Belts RN and Josue Hector RN verified by listening to heart beat for a minute each. Family (sister [name]) and elink called. Post-mortem checklist completed and patient placement called. Patient transferred to mortem at 2142

## 2021-12-14 NOTE — Consult Note (Signed)
NAME:  Henry Phillips, MRN:  [id number], DOB:  [birth year], LOS: 4 ADMISSION DATE:  11/04/2021, CONSULTATION DATE: 11/15/2021 REFERRING MD: Triad, CHIEF COMPLAINT: Altered mental status hypotension chronic osteomyelitis  History of Present Illness:  61 year old male with chronic osteomyelitis of the pelvis region who underwent 6 weeks of antimicrobial therapy via PICC line approximately 6 months ago.  He has end-stage renal disease chronic hypertension diabetes mellitus insulin-dependent with rising white count and decreasing mental status although he has been getting Haldol and Seroquel for agitation.  He has been made a DNR on 11/15/2021 but family has changed their mind and want low-dose pressors and a stent in ICU to see if he may get better.  He is transferring to the intensive care unit on 11/15/2021 fluid resuscitation along with low-dose peripheral vasopressors.  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   Objective   Blood pressure (!) 103/56, pulse 84, temperature 98.8 F (37.1 C), temperature source Axillary, resp. rate (!) 28, height 5\' 7"  (1.702 m), weight 73.5 kg, SpO2 98 %.    Vent Mode: PRVC FiO2 (%):  [50 %-100 %] 50 % Set Rate:  [28 bmp] 28 bmp Vt Set:  [520 mL] 520 mL PEEP:  [10 RCV89-38 cmH20] 10 cmH20 Plateau Pressure:  [24 cmH20-34 cmH20] 24 cmH20   Intake/Output Summary (Last 24 hours) at 2021-11-24 0818 Last data filed at 24-Nov-2021 0700 Gross per 24  hour  Intake 2174.96 ml  Output 200 ml  Net 1974.96 ml   Filed Weights   11/13/21 1717 11/15/21 1430 2021-11-24 0500  Weight: 77 kg 76.8 kg 73.5 kg    Examination: General: intubated Lungs: mech breath sounds bl, equal chest rise Cardiovascular: Heart sounds are distant, RRR Abdomen: Soft nontender hypoactive bowel sounds Extremities: Right lower extremity with transmetatarsal amputation Neuro: deeply sedated at time of my exam  Labs/imaging reviewed  Resolved Hospital Problem list     Assessment & Plan:  Shock Suspect septic (aspiration pneumonia, OM/bacteremia less likely meningitis) but may be multifactorial.  - check coox, no more IVF for now PPV is 5 - vanc, meropenem - follow cultures and narrow ABX as able - tentatively plan on LP if pursuing aggressive measures - vaso, stress dose steroids - wean levo for map 65  Acute hypoxic hypercapnic respiratory failure Aspiration with mucus plugging s/p bronch overnight. - LTVV, PAD protocol - RASS 0 to -1  Cardiac arrest PEA, brief not following commands yet but does some semipurposeful movement reportedly. - fever avoidance - TTE - minimize sedation - Neuroprotective measures: HOB > 30 degrees, normoglycemia, normothermia, eucapnia, correct electrolytes  Acute metabolic encephalopathy Cefepime neurotoxicity vs sepsis vs meningitis - CTM response to resuscitative measures above, consideration of LP  End-stage renal disease AV graft right - if pursuing aggressive measures will need HD catheter if remains on pressor on non-emergent basis  Diabetes mellitus CBG (last 3)  - SSI  Normally hypertensive - holding antihypertensives  Goals  of care Ongoing discussion with family   Best Practice (right click and "Reselect all SmartList Selections" daily)   Diet/type: NPO DVT prophylaxis: hold for now as we consider LP GI prophylaxis: PPI Lines: N/A Foley:  N/A Code Status:  full code Last date of  multidisciplinary goals of care discussion [will update sister as soon as I'm able to perform neuro [place] [facility]/Critical Care  2021/11/21, 8:18 AM

## 2021-12-14 NOTE — Consult Note (Signed)
Palliative Medicine Inpatient Consult Note  Consulting Provider: Jonetta Osgood, MD  Reason for consult:   [facility] Consult  Reason for Consult? please help with goals of care discussion w family-patient not doing well.   12-01-21  HPI:  Per intake H&P --> 61 year old white male history of end-stage renal disease on hemodialysis via left upper extremity AV fistula, chronic osteomyelitis, recent right transmetatarsal amputation of the foot, hypertension, type 2 diabetes on insulin, anemia chronic kidney disease, history of peripheral vascular disease. Admitted on 5/30 for encephalopathy d/t sepsis. Transferred to ICU on 6/2 for pressor support - suffered a cardiac arrest. Family initially enlisted patient as DNR then reversed code status.   Palliative care consulted for patients declining health state to further address goals of care.  Clinical Assessment/Goals of Care:  *Please note that this is a verbal dictation therefore any spelling or grammatical errors are due to the "[place] [facility]" system interpretation.  I have reviewed medical records including EPIC notes, labs and imaging, received report from bedside RN, assessed the patient.    I called patient's Sister Juliann Pulse this morning to further discuss diagnosis prognosis, GOC, EOL wishes, disposition and options.   I introduced Palliative Medicine as specialized medical care for people living with serious illness. It focuses on providing relief from the symptoms and stress of a serious illness. The goal is to improve quality of life for both the patient and the family.  Medical History Review and Understanding:  Cathy and I reviewed Wendy's history of end-stage renal disease requiring hemodialysis, osteomyelitis, hypertension, type 2 diabetes, peripheral vascular disease, and previous hospitalizations.  Juliann Pulse shares that about 15 years ago [place] suffered from necrotizing fasciitis and  was hospitalized for greater than 3 weeks whereby his likelihood for survival was considered to be fairly dismal.  She expresses that he pulled through that miraculously  Social History:  Larone lives in [place], [place].  He has been living with his sister for the past 14 years.  He never got married.  It is suspected he may have 1 child though the family has no relationship with this child.  He worked in Conservation officer, historic buildings.  He is a man of extreme faith.  Functional and Nutritional State:  Prior to admission Marqual had been living with his sister able to perform basic BADLs.  His sister shares that he had had some changes in his personality after his prolonged hospitalization 15 years ago though he was able to get about for the most part.  He did have his transmetatarsal amputation which further limited his mobility.  His appetite was fairly good.  Advance Directives:  A detailed discussion was had today regarding advanced directives.  [name] and her siblings help to make decision for [place].  Code Status:  I reviewed with [name] that all efforts are presently being made to support Jontez's life inclusive of pressor support, CRRT, and mechanical ventilation.  She shares that she was overwhelmed at the time she was called last night and was told that he kept complaining he could not breathe which caused her to reflexively reverse his CODE STATUS.  We discussed the nature of sepsis and how it starts to cause injury to other organ systems eventually leading to death.  We reviewed that unfortunately Tsugio's body has already sustained great injury and I worry that if we were to perform further cardiopulmonary resuscitation it would result in more harm than benefit. [name] is in agreement  with this and we discussed changing him to a DNAR CODE STATUS.  Discussion:  I reviewed with [name] that we have two roads for which we could travel presently.  One road is continuing along these very  aggressive efforts with [place] knowing that the likelihood of full recovery will be quite poor.  I shared often patients who suffer such acute events have a declined baseline state as compared to prior to admission.  Patient's sister acknowledges this as she shares he has been less functional over the past few years after having endured a prolonged hospital stay than he was prior to that.  We reviewed if the decision is made to not continue down an aggressive path then the focus would be on comfort and alleviating any symptom burden that Harith could experience.  I reviewed the medications that typically we use to relieve pain, anxiety, shortness of breath.  I discussed that in instances such as Silvano's when we are talking about compassionately extubating we do have medication drips running to support symptoms.  [name] realizes that all of the measures we are offering now may be prolonging the inevitable.  She has family coming down from [place] and would like to discuss his clinical situation with them prior to proceeding with any additional decisions.  Discussed the importance of continued conversation with family and their  medical providers regarding overall plan of care and treatment options, ensuring decisions are within the context of the patients values and GOCs.  Decision Maker: Earney Hamburg (sister) [phone]  SUMMARY OF RECOMMENDATIONS   DNAR  Reviewed with patient's sister, [name] the severe sepsis which patient has experienced and the supportive measures being pursued for him at this time  Discussed the option of continuing aggressive modalities of care versus transitioning to comfort care  Patient's sister has additional family coming down from [place] with whom she would like to speak prior to making additional decisions  Have agreed to speak to family via conference call later this afternoon to support additional decisions  Palliative care will continue to see Louie Casa during  hospitalization  Code Status/Advance Care Planning: DNAR   Palliative Prophylaxis:  Aspiration, Bowel Regimen, Delirium Protocol, Frequent Pain Assessment, Oral Care, Palliative Wound Care, and Turn Reposition  Additional Recommendations (Limitations, Scope, Preferences): Continue current measures of support  Psycho-social/Spiritual:  Desire for further Chaplaincy support: Yes Additional Recommendations: Education on severe sepsis   Prognosis: Without supportive measures will be limited hours to days  Discharge Planning: Discharge plan uncertain at this time.  Vitals:   Dec 11, 2021 0530 Dec 11, 2021 0600  BP:    Pulse: 80 82  Resp: (!) 28 (!) 28  Temp:    SpO2: 100% 98%    Intake/Output Summary (Last 24 hours) at 2021-12-11 0654 Last data filed at 2021/12/11 0500 Gross per 24 hour  Intake 1932.04 ml  Output 200 ml  Net 1732.04 ml   Last Weight  Most recent update: 12/11/2021  5:35 AM    Weight  73.5 kg (162 lb 0.6 oz)            Gen: Older Caucasian male in no acute distress HEENT: ETT, dry mucous membranes CV: Regular rate and rhythm PULM: On mechanical ventilator ABD: soft/nontender EXT: No edema Neuro: Does not awaken this morning for me though was noted to be on sedation  PPS: 10%   This conversation/these recommendations were discussed with patient primary care team, Dr. Verlee Monte  Billing based on MDM: High  Problems Addressed: One acute or chronic  illness or injury that poses a threat to life or bodily function  Amount and/or Complexity of Data: Category 3:Discussion of management or test interpretation with external physician/other qualified health care professional/appropriate source (not separately reported)  Risks: Decision not to resuscitate or to de-escalate care because of poor prognosis ______________________________________________________ [facility] Team Team Cell Phone: [phone] Please utilize secure chat  with additional questions, if there is no response within 30 minutes please call the above phone number  Palliative Medicine Team providers are available by phone from 7am to 7pm daily and can be reached through the team cell phone.  Should this patient require assistance outside of these hours, please call the patient's attending physician.

## 2021-12-14 NOTE — Progress Notes (Signed)
Patient terminally extubated per MD order. No complications. RN at bedside.

## 2021-12-14 NOTE — Progress Notes (Signed)
Spoke w/ pts sister Juliann Pulse to provide updates. Juliann Pulse appreciative.

## 2021-12-14 NOTE — Progress Notes (Signed)
[facility] Progress Note    Assessment/ Plan:   ESRD:  -Outpatient orders: [place], TTS, 3 hours 45 minutes, F1 80, BFR 450/autoflow 1.5, EDW 85kg, 2K, 3Ca.  Meds: Hectorol 3 mcg q. treatment, heparin 2000 units bolus, 1000 units mid run -no indication for renal replacement therapy as of now given his volume status is relatively stable.  She was seen to be discussed by primary service and if pursuing aggressive measures, then will need a temporary catheter to be placed to start CRRT   Acute metabolic encephalopathy -MRI without any acute abnormalities. Per primary. Possible cefepime induced neurotoxicity. Neuro on board.  Sepsis/meningitis?  PEA 11/15/2021 -Supportive treatment per CCM  Shock -IV fluids stopped, on broad-spectrum antibiotics, possible LP if pursuing aggressive measures, pressor support per primary service  AHRF -Status post bronc 6/3 for mucus plugging, intubated  Leukocytosis, sepsis? -on broad spec abx  Volume/ hypotension -EDW 85 kg. Initially hypertensive on presentation -has been hypotensive with HD which seems to be a chronic issue -remains euvolemic on exam -Now in shock  Osteomyelitis, chronic -per primary   Anemia of Chronic Kidney Disease:  -Hemoglobin 11.1, not receiving any iron or ESA's as an outpatient.  Monitor for now.     Secondary Hyperparathyroidism/Hyperphosphatemia: Resume home sevelamer, Sensipar. Hectorol resumed  DM2 -Management per primary service  Discussed with primary service.    Subjective:   Had desaturated yesterday, intubated, then had PEA with 1 round of CPR.  Bronc performed overnight which revealed a right lower lobe collapse/mucous plug.  Is now on levo and vaso.  Left IJ central line placed Further goals of care to be discussed by primary service to determine if CRRT would be in his best interest.   Objective:   BP (!) 103/56   Pulse 84   Temp 98.8 F (37.1 C) (Axillary)   Resp (!) 28    Ht 5\' 7"  (1.702 m)   Wt 73.5 kg   SpO2 100%   BMI 25.38 kg/m   Intake/Output Summary (Last 24 hours) at 2021-11-25 0934 Last data filed at 11-25-2021 0800 Gross per 24 hour  Intake 2174.96 ml  Output 225 ml  Net 1949.96 ml   Weight change:   Physical Exam: Gen: Intubated, sedated CVS:rrr, s1s2 Resp:cta bl, intubated ASN:KNLZ, nt Ext: RLE-transmetatarsal amputation, no significant edema Neuro: Sedated Dialysis access: LUE AVF +b/t  Imaging: DG Chest 1 View  Result Date: 11/14/2021 CLINICAL DATA:  Leukocytosis and vomiting. EXAM: CHEST  1 VIEW COMPARISON:  None Available. FINDINGS: The heart size and mediastinal contours are within normal limits. Both lungs are clear. Multilevel degenerative changes are seen throughout the thoracic spine. IMPRESSION: No active cardiopulmonary disease. Electronically Signed   By: Virgina Norfolk M.D.   On: 11/14/2021 19:07   CT Angio Chest Pulmonary Embolism (PE) W or WO Contrast  Result Date: 11/15/2021 CLINICAL DATA:  Pulmonary embolism (PE) suspected, positive D-dimer r/o EXAM: CT ANGIOGRAPHY CHEST WITH CONTRAST TECHNIQUE: Multidetector CT imaging of the chest was performed using the standard protocol during bolus administration of intravenous contrast. Multiplanar CT image reconstructions and MIPs were obtained to evaluate the vascular anatomy. RADIATION DOSE REDUCTION: This exam was performed according to the departmental dose-optimization program which includes automated exposure control, adjustment of the mA and/or kV according to patient size and/or use of iterative reconstruction technique. CONTRAST:  30mL OMNIPAQUE IOHEXOL 350 MG/ML SOLN COMPARISON:  None Available. FINDINGS: Cardiovascular: Heart size normal. No pericardial effusion. The RV is nondilated. Satisfactory opacification  of pulmonary arteries noted, and there is no evidence of pulmonary emboli. Extensive coronary calcifications. Calcified plaque in the aortic arch. Adequate contrast  opacification of the thoracic aorta with no evidence of dissection, aneurysm, or stenosis. There is classic 3-vessel brachiocephalic arch anatomy without proximal stenosis. Mediastinum/Nodes: Patulous thick-walled esophagus. Subcentimeter subcarinal lymph nodes. No hilar adenopathy. Lungs/Pleura: Fluid/debris occludes several left lower lobe segmental bronchi. No pleural effusion. No pneumothorax. Minimal subsegmental atelectasis posteriorly in the lung bases right worse than left. Breathing motion degrades some images. Upper Abdomen: No acute findings. Musculoskeletal: Anterior vertebral endplate spurring at multiple levels in the mid and lower thoracic spine. Review of the MIP images confirms the above findings. IMPRESSION: 1. Negative for acute PE or thoracic aortic dissection. 2. Fluid/debris occluding several right lower lobe segmental bronchi. 3. Coronary and Aortic Atherosclerosis (ICD10-170.0). 4. Patulous thick-walled thoracic esophagus. Electronically Signed   By: Lucrezia Europe M.D.   On: 11/15/2021 17:03   US SCROTUM  Result Date: 11/15/2021 CLINICAL DATA:  Right groin mass on CT. EXAM: ULTRASOUND OF SCROTUM TECHNIQUE: Complete ultrasound examination of the testicles, epididymis, and other scrotal structures was performed. COMPARISON:  Abdominopelvic CT 11/15/2021 FINDINGS: Right testicle Presumed right testicle in the inguinal canal measuring 3.3 x 1.3 x 2 cm. The adjacent epididymis also appears to be in the inguinal canal. Testicular blood flow is seen. There is a 4 mm peripheral cyst, no definite solid mass. No microlithiasis Left testicle Presume left testicle is located in the anteromedial upper thigh measuring 3.3 x 1.3 x 2.2 cm. No testicular mass. No microlithiasis. Testicular blood flow is seen. Right epididymis: Tentatively visualized adjacent to the inguinal testis and unremarkable. Left epididymis: Tentatively visualized adjacent to the thigh testis and unremarkable. Hydrocele:  None  visualized. Varicocele: Present bilaterally, least some of the varicoceles on the right appear thrombosed. IMPRESSION: 1. Abnormal positioning of both testis not within the scrotum. Recommend correlation with physical exam. 2. The right testis appears to be within the inguinal canal and corresponds to the soft tissue density on CT. There is a small peripheral cyst within the right testis without solid mass. 3. The left testis appears to be within the anteromedial upper thigh. 4. Bilateral varicoceles, some of which appear thrombosed on the right. Electronically Signed   By: Keith Rake M.D.   On: 11/15/2021 20:08   CT ABDOMEN PELVIS W CONTRAST  Result Date: 11/15/2021 CLINICAL DATA:  Nausea and vomiting.  On dialysis. EXAM: CT ABDOMEN AND PELVIS WITH CONTRAST TECHNIQUE: Multidetector CT imaging of the abdomen and pelvis was performed using the standard protocol following bolus administration of intravenous contrast. RADIATION DOSE REDUCTION: This exam was performed according to the departmental dose-optimization program which includes automated exposure control, adjustment of the mA and/or kV according to patient size and/or use of iterative reconstruction technique. CONTRAST:  8mL OMNIPAQUE IOHEXOL 300 MG/ML  SOLN COMPARISON:  None Available. FINDINGS: Lower chest: No acute abnormality. Hepatobiliary: No focal liver abnormality is seen. No gallstones, gallbladder wall thickening, or biliary dilatation. Pancreas: Unremarkable. No pancreatic ductal dilatation or surrounding inflammatory changes. Spleen: Normal in size without focal abnormality. Adrenals/Urinary Tract: There is bilateral renal atrophy. There is no hydronephrosis. Adrenal glands are within normal limits. There is mild diffuse bladder wall thickening with mild surrounding inflammatory stranding. Stomach/Bowel: There is a large air-fluid level in the stomach. There is some fluid distention of the distal esophagus and distal esophageal wall  thickening. Appendix appears normal. No evidence of bowel wall thickening, distention, or  inflammatory changes. Vascular/Lymphatic: Aortic atherosclerosis. No enlarged abdominal or pelvic lymph nodes. There are mildly enlarged right inguinal lymph nodes measuring up to 11 mm short axis. Reproductive: Prostate is unremarkable. Other: No abdominal wall hernia or abnormality. No abdominopelvic ascites. There is a 1.2 x 3.8 x 2.6 cm nonenhancing low attenuation area in the right inguinal region/proximal anterior thigh. This is incompletely imaged. Degenerative changes affect the spine. Musculoskeletal: No acute or significant osseous findings. IMPRESSION: 1. Mild bladder wall thickening and inflammation concerning for cystitis. 2. Esophageal wall thickening and inflammation worrisome for esophagitis. There is fluid in the distal esophagus compatible with gastroesophageal reflux. 3. Large air-fluid level noted in the stomach. Please correlate clinically for gastroparesis or gastric outlet obstruction. 4. Low-attenuation area in the subcutaneous tissues of the right inguinal region/proximal anterior thigh measuring 1.2 x 3.8 x 2.6 cm. Findings are indeterminate and may represent a complex fluid collection or other soft tissue mass. There are mildly enlarged right inguinal lymph nodes. Recommend clinical correlation and follow-up. Electronically Signed   By: Ronney Asters M.D.   On: 11/15/2021 02:14   MR FOOT RIGHT WO CONTRAST  Result Date: 11/15/2021 CLINICAL DATA:  Foot swelling, nondiabetic, osteomyelitis suspected leukocytosis-unclear source-recent TMA-r/o osteo/abscess EXAM: MRI OF THE RIGHT FOREFOOT WITHOUT CONTRAST TECHNIQUE: Multiplanar, multisequence MR imaging of the right foot was performed. No intravenous contrast was administered. COMPARISON:  None Available. FINDINGS: Technical Note: Despite efforts by the technologist and patient, motion artifact is present on today's exam and could not be eliminated.  This reduces exam sensitivity and specificity. 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 Prominent bone marrow edema with intermediate to low T1 marrow signal within each of the residual metatarsal shafts as well as the first and second metatarsal bases. Relative sparing of the third through fifth metatarsal bases. Findings are compatible with acute osteomyelitis. No definite findings of acute osteomyelitis within the midfoot on significantly motion degraded images. No gross malalignment. Ligaments Suboptimally assessed by motion. Muscles and Tendons Post amputation changes of the musculotendinous structures. No intramuscular fluid collections are seen. Soft tissues Probable wound or ulceration distal to the second metatarsal. No fluid collections are evident. IMPRESSION: 1. Motion degraded exam. 2. Prior transmetatarsal amputation of the right forefoot with findings of acute osteomyelitis involving the residual first through fifth metatarsals. 3. Probable wound or ulceration distal to the second metatarsal. No fluid collections evident. Electronically Signed   By: Davina Poke D.O.   On: 11/15/2021 16:44   DG CHEST PORT 1 VIEW  Result Date: 11/15/2021 CLINICAL DATA:  Encounter for central line placement. EXAM: PORTABLE CHEST 1 VIEW COMPARISON:  Chest x-ray 11/15/2021 FINDINGS: Endotracheal tube tip is 1.9 cm above the carina. Enteric tube tip is in the gastric antrum. Left-sided central venous catheter tip projects over the SVC. Right lower lobe collapse persists. Left lung is clear. Cardiomediastinal silhouette is stable. IMPRESSION: 1. Left-sided central venous catheter tip projects over the SVC. 2. Stable right lower lobe collapse. Electronically Signed   By: Ronney Asters M.D.   On: 11/15/2021 23:22   Portable Chest x-ray  Result Date: 11/15/2021 CLINICAL DATA:  Intubation. EXAM: PORTABLE CHEST 1 VIEW COMPARISON:  Chest x-ray 11/15/2021 FINDINGS:  Endotracheal tube tip is 2 cm above the carina. Enteric tube extends below the diaphragm. There is new collapse of the right lower lobe. Left lung is clear. Right pleural effusion is not excluded. No pneumothorax. Cardiomediastinal silhouette is within normal limits. No fractures are seen. IMPRESSION:  1. There is new collapse of the right lower lobe, indeterminate. Can not exclude right pleural effusion. Please correlate clinically. 2. Endotracheal tube tip 2 cm above carina. Electronically Signed   By: Ronney Asters M.D.   On: 11/15/2021 21:45   DG Chest Port 1V same Day  Result Date: 11/15/2021 CLINICAL DATA:  Shortness of breath. EXAM: PORTABLE CHEST 1 VIEW COMPARISON:  Chest x-ray from yesterday. FINDINGS: The heart size and mediastinal contours are within normal limits. Both lungs are clear. The visualized skeletal structures are unremarkable. IMPRESSION: No active disease. Electronically Signed   By: Titus Dubin M.D.   On: 11/15/2021 09:03   DG Abd Portable 1V  Result Date: 11/14/2021 CLINICAL DATA:  Leukocytosis, vomiting. EXAM: PORTABLE ABDOMEN - 1 VIEW COMPARISON:  None Available. FINDINGS: The bowel gas pattern is normal. Surgical clips overlie the abdomen. Vascular/seminal vesicle calcifications. Levoconvex curvature of the lumbar spine with associated degenerative change. IMPRESSION: Nonobstructive bowel gas pattern. Electronically Signed   By: Dahlia Bailiff M.D.   On: 11/14/2021 18:11   EEG adult  Result Date: 11/15/2021 Lora Havens, MD     11/15/2021  8:26 AM Patient Name: Council Munguia MRN: [id number] Epilepsy Attending: Lora Havens Referring Physician/Provider: Amie Portland, MD Date: 11/14/2021 Duration: 24.52 mins Patient history: 61 year old male with sudden onset confusion and altered mental status.  EEG to evaluate for seizure. Level of alertness:  lethargic AEDs during EEG study: None Technical aspects: This EEG study was done with scalp electrodes positioned according  to the 10-20 International system of electrode placement. Electrical activity was acquired at a sampling rate of 500Hz  and reviewed with a high frequency filter of 70Hz  and a low frequency filter of 1Hz . EEG data were recorded continuously and digitally stored. Description: No clear posterior dominant rhythm was seen.  EEG showed continuous generalized polymorphic mixed frequencies with predominantly 5 to 8 Hz theta-alpha activity admixed with intermittent generalized 2 to 3 Hz delta slowing.  Hyperventilation and photic stimulation were not performed.   ABNORMALITY - Continuous slow, generalized IMPRESSION: This study is suggestive of moderate diffuse encephalopathy, nonspecific etiology. No seizures or epileptiform discharges were seen throughout the recording. Lora Havens   ECHOCARDIOGRAM COMPLETE  Result Date: 12/16/2021    ECHOCARDIOGRAM REPORT   Patient Name:   [facility] Date of Exam: 12/16/2021 Medical Rec #:  [id number]          Height:       67.0 in Accession #:    [id number]         Weight:       162.0 lb Date of Birth:  [birth year]           BSA:          1.849 m Patient Age:    61 years           BP:           88/49 mmHg Patient Gender: M                  HR:           85 bpm. Exam Location:  Inpatient Procedure: 2D Echo, Color Doppler, Cardiac Doppler and Intracardiac            Opacification Agent Indications:    Cardiac Arrest i46.9  History:        Patient has no prior history of Echocardiogram examinations.  Risk Factors:Hypertension and Diabetes.  Sonographer:    Raquel Sarna Senior RDCS Referring Phys: [id number] CHI JANE ELLISON  Sonographer Comments: Scanned supine on artificial respirator. IMPRESSIONS  1. Left ventricular wall motion analysis is challenging even with Definity contrast, due to poor echo windows. There appears to be inferolateral wall pseudodyskinesis (diastolic inward displacement with rapid systolic outward displacement of the wall, due to high  infradiaphragmatic pressures), rather than true dyskinesis. Left ventricular ejection fraction, by estimation, is 45 to 50%. The left ventricle has mildly decreased function. Left ventricular endocardial border not optimally defined to evaluate regional wall motion. Left ventricular diastolic parameters are consistent with Grade II diastolic dysfunction (pseudonormalization). Elevated left atrial pressure.  2. Right ventricular systolic function is moderately reduced. The right ventricular size is normal. There is mildly elevated pulmonary artery systolic pressure. The estimated right ventricular systolic pressure is 02.7 mmHg.  3. The mitral valve is normal in structure. No evidence of mitral valve regurgitation. No evidence of mitral stenosis.  4. The aortic valve is tricuspid. Aortic valve regurgitation is not visualized. No aortic stenosis is present.  5. The inferior vena cava is dilated in size with <50% respiratory variability, suggesting right atrial pressure of 15 mmHg. Comparison(s): Hard to exclude true dyskinesis of the inferolateral wall. There are Doppler signs of severely reduced cardiac output, which may be primarily related to RV dysfunction. FINDINGS  Left Ventricle: Left ventricular wall motion analysis is challenging even with Definity contrast, due to poor echo windows. There appears to be inferolateral wall pseudodyskinesis (diastolic inward displacement with rapid systolic outward displacement of the wall, due to high infradiaphragmatic pressures), rather than true dyskinesis. Left ventricular ejection fraction, by estimation, is 45 to 50%. The left ventricle has mildly decreased function. Left ventricular endocardial border not optimally defined to evaluate regional wall motion. Definity contrast agent was given IV to delineate the left ventricular endocardial borders. The left ventricular internal cavity size was normal in size. There is no left ventricular hypertrophy. Left ventricular   diastolic parameters are consistent with Grade II diastolic dysfunction (pseudonormalization). Elevated left atrial pressure. Right Ventricle: The right ventricular size is normal. No increase in right ventricular wall thickness. Right ventricular systolic function is moderately reduced. There is mildly elevated pulmonary artery systolic pressure. The tricuspid regurgitant velocity is 2.27 m/s, and with an assumed right atrial pressure of 15 mmHg, the estimated right ventricular systolic pressure is 25.3 mmHg. Left Atrium: Left atrial size was normal in size. Right Atrium: Right atrial size was normal in size. Pericardium: There is no evidence of pericardial effusion. Mitral Valve: The mitral valve is normal in structure. No evidence of mitral valve regurgitation. No evidence of mitral valve stenosis. Tricuspid Valve: The tricuspid valve is normal in structure. Tricuspid valve regurgitation is not demonstrated. No evidence of tricuspid stenosis. Aortic Valve: The aortic valve is tricuspid. Aortic valve regurgitation is not visualized. No aortic stenosis is present. Pulmonic Valve: The pulmonic valve was normal in structure. Pulmonic valve regurgitation is not visualized. No evidence of pulmonic stenosis. Aorta: The aortic root is normal in size and structure. Venous: IVC assessment for right atrial pressure unable to be performed due to mechanical ventilation. The inferior vena cava is dilated in size with less than 50% respiratory variability, suggesting right atrial pressure of 15 mmHg. IAS/Shunts: No atrial level shunt detected by color flow Doppler.  LEFT VENTRICLE PLAX 2D LVIDd:         3.55 cm   Diastology LVIDs:  2.70 cm   LV e' medial:    3.26 cm/s LV PW:         0.70 cm   LV E/e' medial:  19.9 LV IVS:        0.90 cm   LV e' lateral:   3.15 cm/s LVOT diam:     1.90 cm   LV E/e' lateral: 20.6 LV SV:         28 LV SV Index:   15 LVOT Area:     2.84 cm  RIGHT VENTRICLE RV S prime:     9.03 cm/s TAPSE  (M-mode): 1.1 cm LEFT ATRIUM             Index        RIGHT ATRIUM          Index LA diam:        2.10 cm 1.14 cm/m   RA Area:     9.89 cm LA Vol (A2C):   24.7 ml 13.36 ml/m  RA Volume:   22.30 ml 12.06 ml/m LA Vol (A4C):   30.9 ml 16.71 ml/m LA Biplane Vol: 30.4 ml 16.44 ml/m  AORTIC VALVE LVOT Vmax:   75.70 cm/s LVOT Vmean:  45.620 cm/s LVOT VTI:    0.100 m  AORTA Ao Root diam: 3.00 cm MITRAL VALVE               TRICUSPID VALVE MV Area (PHT): 2.10 cm    TR Peak grad:   20.6 mmHg MV Decel Time: 362 msec    TR Vmax:        227.00 cm/s MV E velocity: 65.00 cm/s MV A velocity: 61.30 cm/s  SHUNTS MV E/A ratio:  1.06        Systemic VTI:  0.10 m                            Systemic Diam: 1.90 cm Mihai Croitoru MD Electronically signed by Sanda Klein MD Signature Date/Time: 11-21-21/9:31:01 AM    Final     Labs: BMET Recent Labs  Lab 10/25/2021 0038 11/13/21 4680 11/14/21 3212 11/15/21 2482 11/15/21 2127 11/15/21 2219 11/15/21 2227 21-Nov-2021 0512 11-21-21 0531  NA 141 141 140 139 138 139 139 134* 136  K 4.4 4.0 3.5 3.8 5.2* 3.8 3.5 4.3 4.5  CL 102 104 97* 92*  --  95*  --   --  96*  CO2 24 20* 25 21*  --  22  --   --  18*  GLUCOSE 212* 173* 82 140*  --  248*  --   --  334*  BUN 44* 53* 29* 30*  --  59*  --   --  64*  CREATININE 8.40* 10.18* 6.68* 5.87*  --  7.42*  --   --  7.93*  CALCIUM 9.8 9.9 9.7 9.7  --  8.8*  --   --  8.7*  PHOS  --  4.6  --   --   --   --   --   --  8.8*   CBC Recent Labs  Lab 10/21/2021 0038 11/13/21 0224 11/14/21 0051 11/15/21 0238 11/15/21 2127 11/15/21 2219 11/15/21 2227 11/21/21 0512 Nov 21, 2021 0531  WBC 12.8*   < > 15.8* 30.2*  --  33.9*  --   --  37.3*  NEUTROABS 9.2*  --   --  26.4*  --   --   --   --   --  HGB 11.5*   < > 11.8* 12.5*   < > 11.0* 10.9* 10.9* 11.1*  HCT 33.4*   < > 36.0* 38.0*   < > 33.6* 32.0* 32.0* 32.1*  MCV 91.5   < > 92.5 92.9  --  93.1  --   --  90.9  PLT 205   < > 231 214  --  323  --   --  330   < > = values in this  interval not displayed.    Medications:     Chlorhexidine Gluconate Cloth  6 each Topical Q0600   docusate  100 mg Per Tube BID   doxercalciferol  3 mcg Intravenous Q T,Th,Sa-HD   hydrocortisone sod succinate (SOLU-CORTEF) inj  100 mg Intravenous Q12H   insulin aspart  0-6 Units Subcutaneous Q4H   insulin glargine-yfgn  10 Units Subcutaneous QHS   mouth rinse  15 mL Mouth Rinse BID   pantoprazole sodium  40 mg Per Tube Daily   polyethylene glycol  17 g Per Tube Daily   sodium chloride flush  10-40 mL Intracatheter Q12H   vancomycin variable dose per unstable renal function (pharmacist dosing)   Does not apply See admin instructions      Gean Quint, MD [facility] November 18, 2021, 9:34 AM

## 2021-12-14 NOTE — Progress Notes (Addendum)
Neurology Progress Note   S:// Patient seen and examined. No family at bedside. Patient is intubated, comfortable on vasopressin, levophed drips and precedex @ 0.68mcg/min and Fentanyl @ 60mcg IV drips.  He is responsive, will open eyes to voice. Moves all extremities, and following commands.    O:// Current vital signs: BP (!) 172/65   Pulse 81   Temp 98.3 F (36.8 C) (Axillary)   Resp (!) 28   Ht 5\' 7"  (1.702 m)   Wt 73.5 kg   SpO2 100%   BMI 25.38 kg/m  Vital signs in last 24 hours: Temp:  [97.7 F (36.5 C)-98.8 F (37.1 C)] 98.3 F (36.8 C) (06/03 1200) Pulse Rate:  [77-137] 81 (06/03 1153) Resp:  [13-37] 28 (06/03 0754) BP: (71-172)/(23-73) 172/65 (06/03 1153) SpO2:  [62 %-100 %] 100 % (06/03 0715) Arterial Line BP: (65-154)/(33-71) 134/61 (06/03 0715) FiO2 (%):  [40 %-100 %] 40 % (06/03 1155) Weight:  [73.5 kg-76.8 kg] 73.5 kg (06/03 0500)  GENERAL: elderly, critically ill male intubated in NAD  HEENT: - Normocephalic and atraumatic, dry mm LUNGS - Coarse bilaterally with no wheezes CV - S1S2 RRR, no m/r/g, equal pulses bilaterally. ABDOMEN - Soft, nontender, nondistended with normoactive BS Ext: bandage on right foot, warm, well perfused, intact peripheral pulses, no edema  NEURO:  Mental Status: lethargic, intubated on Precedex and fentanyl gtts. Responsive to voice, following commands, nods appropriately to questions moving all extremities spontaneously  Cranial Nerves: PERRL 2 mm/brisk. EOMI, visual fields blinks to threat bilaterally no facial asymmetry, facial sensation intact, hearing intact, tongue/uvula/soft palate midline, normal sternocleidomastoid and trapezius muscle strength. No evidence of tongue atrophy or fibrillations Motor: moves all 4 extremities spontaneously and equal antigravity  Tone: is normal and bulk is normal Sensation- Intact to light touch bilaterally Coordination: intact  Gait- deferred  Medications  Current Facility-Administered  Medications:    0.9 %  sodium chloride infusion, 250 mL, Intravenous, Continuous, Minor, Grace Bushy, NP, Last Rate: 10 mL/hr at 2021/11/27 0700, Infusion Verify at 11-27-21 0700   acetaminophen (TYLENOL) tablet 650 mg, 650 mg, Oral, Q6H PRN **OR** acetaminophen (TYLENOL) suppository 650 mg, 650 mg, Rectal, Q6H PRN, Kristopher Oppenheim, DO   Chlorhexidine Gluconate Cloth 2 % PADS 6 each, 6 each, Topical, Q0600, Gean Quint, MD, 6 each at 11-27-21 1041   dexmedetomidine (PRECEDEX) 400 MCG/100ML (4 mcg/mL) infusion, 0.4-1.2 mcg/kg/hr, Intravenous, Titrated, Collier Bullock, MD, Last Rate: 13.44 mL/hr at 11-27-2021 0848, 0.7 mcg/kg/hr at 27-Nov-2021 0848   docusate (COLACE) 50 MG/5ML liquid 100 mg, 100 mg, Per Tube, BID, Andres Labrum D, PA-C, 100 mg at 11-27-2021 1022   doxercalciferol (HECTOROL) injection 3 mcg, 3 mcg, Intravenous, Q T,Th,Sa-HD, Gean Quint, MD   fentaNYL (SUBLIMAZE) injection 50 mcg, 50 mcg, Intravenous, Q15 min PRN, Andres Labrum D, PA-C   fentaNYL (SUBLIMAZE) injection 50-200 mcg, 50-200 mcg, Intravenous, Q30 min PRN, Andres Labrum D, PA-C, 100 mcg at 11/27/21 0052   fentaNYL 2550mcg in NS 269mL (29mcg/ml) infusion-PREMIX, 0-200 mcg/hr, Intravenous, Continuous, Collier Bullock, MD, Last Rate: 3.5 mL/hr at 11/27/2021 0700, 35 mcg/hr at 11/27/2021 0700   hydrocortisone sodium succinate (SOLU-CORTEF) 100 MG injection 100 mg, 100 mg, Intravenous, Q12H, Maryjane Hurter, MD, 100 mg at 11/27/2021 1024   insulin aspart (novoLOG) injection 0-6 Units, 0-6 Units, Subcutaneous, Q4H, Margaretha Seeds, MD, 5 Units at 11-27-2021 1043   insulin glargine-yfgn [facility]) injection 10 Units, 10 Units, Subcutaneous, QHS, Kristopher Oppenheim, DO, 10 Units at 11/14/21 2113   MEDLINE mouth  rinse, 15 mL, Mouth Rinse, BID, Chand, Sudham, MD, 15 mL at 11-27-21 1035   [START ON 11/17/2021] meropenem (MERREM) 1 g in sodium chloride 0.9 % 100 mL IVPB, 1 g, Intravenous, Q24H, Meier, Hortencia Conradi, MD   midazolam (VERSED) injection 2 mg, 2 mg,  Intravenous, Q15 min PRN, Andres Labrum D, PA-C   midazolam (VERSED) injection 2 mg, 2 mg, Intravenous, Q2H PRN, Andres Labrum D, PA-C   norepinephrine (LEVOPHED) 16 mg in 237mL premix infusion, 0-40 mcg/min, Intravenous, Titrated, Henri Medal, RPH, Last Rate: 18.75 mL/hr at Nov 27, 2021 0841, 20 mcg/min at 11-27-21 0841   pantoprazole sodium (PROTONIX) 40 mg/20 mL oral suspension 40 mg, 40 mg, Per Tube, Daily, Andres Labrum D, PA-C, 40 mg at 11/27/2021 1023   polyethylene glycol (MIRALAX / GLYCOLAX) packet 17 g, 17 g, Per Tube, Daily, Andres Labrum D, PA-C, 17 g at 27-Nov-2021 1023   sodium chloride 0.9 % bolus 500 mL, 500 mL, Intravenous, Once, Minor, Grace Bushy, NP   sodium chloride flush (NS) 0.9 % injection 10-40 mL, 10-40 mL, Intracatheter, Q12H, Collier Bullock, MD, 10 mL at 11-27-2021 1032   sodium chloride flush (NS) 0.9 % injection 10-40 mL, 10-40 mL, Intracatheter, PRN, Collier Bullock, MD   vancomycin variable dose per unstable renal function (pharmacist dosing), , Does not apply, See admin instructions, Susa Raring, RPH   vasopressin (PITRESSIN) 20 Units in sodium chloride 0.9 % 100 mL infusion-*FOR SHOCK*, 0-0.03 Units/min, Intravenous, Continuous, Mick Sell, PA-C, Last Rate: 9 mL/hr at 2021-11-27 0849, 0.03 Units/min at 11/27/2021 0849   Imaging I have reviewed images in epic and the results pertinent to this consultation are:  MRI examination of the brain 5/30: 1. No acute intracranial abnormality. 2. Mild chronic microvascular ischemic disease for age  rEEG 6/1: No seizures identified   LABS:  Ammonia 28  Assessment:  61 year old with above past medical history presenting for sudden onset of confusion and altered mental status.  On examination he has perseveration and inability to name objects or repeat sentences but he is able to follow some simple commands.  He has asterixis on outstretched arms.  His lab findings reveal increased BUN and creatinine-he is an ESRD patient.  Acute  metabolic encephalopathy likely multifactorial due to uremia, medications and potential infections now with s/p PEA CPR x 1 after intubation although brief concerning for possible anoxia.    Recommendations: - Continue to treat infection and correct metabolic abnormalities. ID on board.  - Hold Plavix and heparin SQ for possible LP  -  LP if encephalopathy does not improve in a few days to r/o CNS infection although already on abx coverage(meropenem and vanc) as there is concern for sepsis.    Beulah Gandy DNP, ACNPC- AG    ATTENDING ATTESTATION:  Dr. Reeves Forth evaluated pt independently, reviewed imaging, chart, labs. Discussed and formulated plan with the APP. Please see APP note above for details.   Time spent on counseling patient and coordinating care, writing notes and reviewing chart.    This patient is critically ill due to respiratory distress,AMS, encephalopathy, possible sepsis and at significant risk of neurological worsening, death form heart failure, respiratory failure, recurrent stroke, bleeding from [facility], seizure, sepsis. This patient's care requires constant monitoring of vital signs, hemodynamics, respiratory and cardiac monitoring, review of multiple databases, neurological assessment, discussion with family, other specialists and medical decision making of high complexity. I spent 35 minutes of neurocritical care time in the care of this patient.   Idora Brosious  Paizleigh Wilds,MD

## 2021-12-14 NOTE — Progress Notes (Signed)
Echocardiogram 2D Echocardiogram has been performed.  Oneal Deputy Aislinn Feliz RDCS 2021/11/25, 8:26 AM  Dr. Gardiner Rhyme notified of stat at 8:25

## 2021-12-14 NOTE — Procedures (Addendum)
Bronchoscopy Procedure Note  Henry Phillips  [id number]  July 05, 1960  Date:11-21-2021  Time:1:19 AM   Provider Performing:Henry Phillips Henry Phillips   Procedure(s):  Flexible Bronchoscopy [phone])  Indiation(s) Mucous plug  Consent Risks of the procedure as well as the alternatives and risks of each were explained to the patient and/or caregiver.  Consent for the procedure was obtained and is signed in the bedside chart Consent from sister, Henry Phillips over phone.   Anesthesia 37m versed 577m fentanyl x 2    Time Out Verified patient identification, verified procedure, site/side was marked, verified correct patient position, special equipment/implants available, medications/allergies/relevant history reviewed, required imaging and test results available.   Sterile Technique Usual hand hygiene, masks, gowns, and gloves were used   Procedure Description Bronchoscope advanced through endotracheal tube and into airway.  Airways were examined down to subsegmental level with findings noted below.    Findings:  Thick yellow/green mucous plugs suctioned from RLL subsegments, superior lobe bronchus.  RUL subsegment also had some thick mucous that was suctioned away.     Complications/Tolerance None; patient tolerated the procedure well. Chest X-ray is not needed post procedure.   EBL none   Specimen(s) Mucous plug, RLL  Did call sister Henry Marylando let her know procedure went well, no answer so left message.

## 2021-12-14 NOTE — Progress Notes (Signed)
Pt now on comfort care.  One way extubation complete, pt on fentanyl and versed gtts, robinul given, pt appears comfortable.

## 2021-12-14 NOTE — Progress Notes (Signed)
Referral made to [facility].

## 2021-12-14 NOTE — Progress Notes (Signed)
   Palliative Medicine Inpatient Follow Up Note   I spoke on the conference line to patients family this afternoon. We reviewed patients metabolic encephalopathy and septic shock. Discussed the multiple modalities of support patient is on inclusive of pressors and mechanical ventilation. We reviewed that Henry Phillips may not improve as they hope. I shared that it is very likely we will continue to decline despite best interventions. We reviewed either continuing present measures or keeping him comfortable. All three siblings agree with keeping him comfortable. I shared it may behoove them to come by to say their goodbyes prior to proceeding with comfort measures which they are in agreement with.  ___________________________  Addendum:  Patients sister, Henry Phillips called. She shares no family will be coming to bedside and to proceed with comfort measures. We reviewed the plan. We talked about transition to comfort measures in house and what that would entail inclusive of medications to control pain, dyspnea, agitation, nausea, itching, and hiccups.   We discussed stopping all uneccessary measures such as mechanical ventilator, pressors, cardiac monitoring, blood draws, needle sticks, and frequent vital signs. Utilized reflective listening throughout our time together given the emotional toll this is having on Henry Phillips.    SUMMARY OF RECOMMENDATIONS   DNAR  Compassionate extubation this afternoon  Comfort medications - midazolam and fentanyl gtt for symptoms  Unrestricted Visitation  Ongoing support  Anticipate in hospital death  Additional Time Spent: 60  Billing based on MDM: High ______________________________________________________________________________________ [place] Team Team Cell Phone: [phone] Please utilize secure chat with additional questions, if there is no response within 30 minutes please call the above phone number  Palliative  Medicine Team providers are available by phone from 7am to 7pm daily and can be reached through the team cell phone.  Should this patient require assistance outside of these hours, please call the patient's attending physician.

## 2021-12-14 NOTE — Death Summary Note (Signed)
DEATH SUMMARY   Patient Details  Name: Henry Phillips MRN: [id number] DOB: 07-23-1960  Admission/Discharge Information   Admit Date:  Nov 25, 2021  Date of Death: Date of Death: 11/29/21  Time of Death: Time of Death: 2043-09-13  Length of Stay: 4  Referring Physician: Pcp, No   Reason(s) for Hospitalization  Septick shock due to aspiration pneumonia Acute toxic and metabolic encephalopathy Acute hypoxic hypercapnic respiratory failure due to aspiration pneumonia In hospital cardiac arrest   Diagnoses  Preliminary cause of death:  Secondary Diagnoses (including complications and co-morbidities):  Principal Problem:   Malignant hypertension Active Problems:   Anemia of chronic disease   ESRD on hemodialysis ([place])   Essential hypertension   Osteomyelitis, unspecified ([place])   Peripheral vascular disease ([place])   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 year old male who was admitted with acute toxic and metabolic encephalopathy due to sepsis from OM and cefepime. His course was complicated by aspiration pneumonia and after discussion with family, decision made for trial of pressors and mechanical ventilation to see if he could recover. Unfortunately by time decision made to proceed with intubation, oxygen saturation already in 70s and challenging to bag mask ventilate. His intubation was complicated by brief cardiac arrest. Post intubation he was in profound shock due to aspiration pneumonia. Palliative care was consulted, leading discussion with pt's sister and ultimately decision made to pursue comfort measures only and the patient was pronounced dead at Sep 13, 2043 on 11/30/22.     Pertinent Labs and Studies  Significant Diagnostic Studies DG Chest 1 View  Result  Date: 11/14/2021 CLINICAL DATA:  Leukocytosis and vomiting. EXAM: CHEST  1 VIEW COMPARISON:  None Available. FINDINGS: The heart size and mediastinal contours are within normal limits. Both lungs are clear. Multilevel degenerative changes are seen throughout the thoracic spine. IMPRESSION: No active cardiopulmonary disease. Electronically Signed   By: Virgina Norfolk M.D.   On: 11/14/2021 19:07   CT Angio Chest Pulmonary Embolism (PE) W or WO Contrast  Result Date: 11/15/2021 CLINICAL DATA:  Pulmonary embolism (PE) suspected, positive D-dimer r/o EXAM: CT ANGIOGRAPHY CHEST WITH CONTRAST TECHNIQUE: Multidetector CT imaging of the chest was performed using the standard protocol during bolus administration of intravenous contrast. Multiplanar CT image reconstructions and MIPs were obtained to evaluate the vascular anatomy. RADIATION DOSE REDUCTION: This exam was performed according to the departmental dose-optimization program which includes automated exposure control, adjustment of the mA and/or kV according to patient size and/or use of iterative reconstruction technique. CONTRAST:  15mL OMNIPAQUE IOHEXOL 350 MG/ML SOLN COMPARISON:  None Available. FINDINGS: Cardiovascular: Heart size normal. No pericardial effusion. The RV is nondilated. Satisfactory opacification of pulmonary arteries noted, and there is no evidence of pulmonary emboli. Extensive coronary calcifications. Calcified plaque in the aortic arch. Adequate contrast opacification of the thoracic aorta with no evidence of dissection, aneurysm, or stenosis. There is classic 3-vessel brachiocephalic arch anatomy without proximal stenosis. Mediastinum/Nodes: Patulous thick-walled esophagus. Subcentimeter subcarinal lymph nodes. No hilar adenopathy. Lungs/Pleura: Fluid/debris occludes several left lower lobe segmental bronchi. No pleural effusion. No pneumothorax. Minimal subsegmental atelectasis posteriorly in the lung bases right worse than left.  Breathing motion degrades some images. Upper Abdomen: No acute findings. Musculoskeletal: Anterior vertebral endplate spurring at multiple levels in the mid and lower  thoracic spine. Review of the MIP images confirms the above findings. IMPRESSION: 1. Negative for acute PE or thoracic aortic dissection. 2. Fluid/debris occluding several right lower lobe segmental bronchi. 3. Coronary and Aortic Atherosclerosis (ICD10-170.0). 4. Patulous thick-walled thoracic esophagus. Electronically Signed   By: Lucrezia Europe M.D.   On: 11/15/2021 17:03   MR BRAIN WO CONTRAST  Result Date: 11/05/2021 CLINICAL DATA:  Initial evaluation for mental status change, unknown cause. EXAM: MRI HEAD WITHOUT CONTRAST TECHNIQUE: Multiplanar, multiecho pulse sequences of the brain and surrounding structures were obtained without intravenous contrast. COMPARISON:  None Available. FINDINGS: Brain: Examination moderately degraded by motion artifact. Cerebral volume within normal limits. Mild chronic microvascular ischemic disease noted involving the periventricular and deep white matter both cerebral hemispheres. 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 No evidence for acute or subacute ischemia. Gray-white matter differentiation maintained. No areas of chronic cortical infarction. No visible acute or chronic intracranial blood products. No mass lesion, mass effect or midline shift. No hydrocephalus or extra-axial fluid collection. Pituitary gland suprasellar region within normal limits. Midline structures intact and normally formed. Vascular: Major intracranial vascular flow voids are grossly maintained at the skull base. Skull and upper cervical spine: Craniocervical junction within normal limits. Bone marrow signal intensity normal. No scalp soft tissue abnormality. Sinuses/Orbits: Prior bilateral ocular lens replacement. Scattered mucosal thickening noted throughout the paranasal sinuses. No significant mastoid  effusion. Other: None. IMPRESSION: 1. No acute intracranial abnormality. 2. Mild chronic microvascular ischemic disease for age. Electronically Signed   By: Jeannine Boga M.D.   On: 11/05/2021 19:14   US SCROTUM  Result Date: 11/15/2021 CLINICAL DATA:  Right groin mass on CT. EXAM: ULTRASOUND OF SCROTUM TECHNIQUE: Complete ultrasound examination of the testicles, epididymis, and other scrotal structures was performed. COMPARISON:  Abdominopelvic CT 11/15/2021 FINDINGS: Right testicle Presumed right testicle in the inguinal canal measuring 3.3 x 1.3 x 2 cm. The adjacent epididymis also appears to be in the inguinal canal. Testicular blood flow is seen. There is a 4 mm peripheral cyst, no definite solid mass. No microlithiasis Left testicle Presume left testicle is located in the anteromedial upper thigh measuring 3.3 x 1.3 x 2.2 cm. No testicular mass. No microlithiasis. Testicular blood flow is seen. Right epididymis: Tentatively visualized adjacent to the inguinal testis and unremarkable. Left epididymis: Tentatively visualized adjacent to the thigh testis and unremarkable. Hydrocele:  None visualized. Varicocele: Present bilaterally, least some of the varicoceles on the right appear thrombosed. IMPRESSION: 1. Abnormal positioning of both testis not within the scrotum. Recommend correlation with physical exam. 2. The right testis appears to be within the inguinal canal and corresponds to the soft tissue density on CT. There is a small peripheral cyst within the right testis without solid mass. 3. The left testis appears to be within the anteromedial upper thigh. 4. Bilateral varicoceles, some of which appear thrombosed on the right. Electronically Signed   By: Keith Rake M.D.   On: 11/15/2021 20:08   CT ABDOMEN PELVIS W CONTRAST  Result Date: 11/15/2021 CLINICAL DATA:  Nausea and vomiting.  On dialysis. EXAM: CT ABDOMEN AND PELVIS WITH CONTRAST TECHNIQUE: Multidetector CT imaging of the abdomen  and pelvis was performed using the standard protocol following bolus administration of intravenous contrast. RADIATION DOSE REDUCTION: This exam was performed according to the departmental dose-optimization program which includes automated exposure control, adjustment of the mA and/or kV according to patient size and/or use of iterative reconstruction technique. CONTRAST:  72mL OMNIPAQUE IOHEXOL 300 MG/ML  SOLN COMPARISON:  None Available. FINDINGS: Lower chest: No acute abnormality. Hepatobiliary: No focal liver abnormality is seen. No gallstones, gallbladder wall thickening, or biliary dilatation. Pancreas: Unremarkable. No pancreatic ductal dilatation or surrounding inflammatory changes. Spleen: Normal in size without focal abnormality. Adrenals/Urinary Tract: There is bilateral renal atrophy. There is no hydronephrosis. Adrenal glands are within normal limits. There is mild diffuse bladder wall thickening with mild surrounding inflammatory stranding. Stomach/Bowel: There is a large air-fluid level in the stomach. There is some fluid distention of the distal esophagus and distal esophageal wall thickening. Appendix appears normal. No evidence of bowel wall thickening, distention, or inflammatory changes. Vascular/Lymphatic: Aortic atherosclerosis. No enlarged abdominal or pelvic lymph nodes. There are mildly enlarged right inguinal lymph nodes measuring up to 11 mm short axis. Reproductive: Prostate is unremarkable. Other: No abdominal wall hernia or abnormality. No abdominopelvic ascites. There is a 1.2 x 3.8 x 2.6 cm nonenhancing low attenuation area in the right inguinal region/proximal anterior thigh. This is incompletely imaged. Degenerative changes affect the spine. Musculoskeletal: No acute or significant osseous findings. IMPRESSION: 1. Mild bladder wall thickening and inflammation concerning for cystitis. 2. Esophageal wall thickening and inflammation worrisome for esophagitis. There is fluid in the  distal esophagus compatible with gastroesophageal reflux. 3. Large air-fluid level noted in the stomach. Please correlate clinically for gastroparesis or gastric outlet obstruction. 4. Low-attenuation area in the subcutaneous tissues of the right inguinal region/proximal anterior thigh measuring 1.2 x 3.8 x 2.6 cm. Findings are indeterminate and may represent a complex fluid collection or other soft tissue mass. There are mildly enlarged right inguinal lymph nodes. Recommend clinical correlation and follow-up. Electronically Signed   By: Ronney Asters M.D.   On: 11/15/2021 02:14   MR FOOT RIGHT WO CONTRAST  Result Date: 11/15/2021 CLINICAL DATA:  Foot swelling, nondiabetic, osteomyelitis suspected leukocytosis-unclear source-recent TMA-r/o osteo/abscess EXAM: MRI OF THE RIGHT FOREFOOT WITHOUT CONTRAST TECHNIQUE: Multiplanar, multisequence MR imaging of the right foot was performed. No intravenous contrast was administered. COMPARISON:  None Available. FINDINGS: Technical Note: Despite efforts by the technologist and patient, motion artifact is present on today's exam and could not be eliminated. This reduces exam sensitivity and specificity. 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 Prominent bone marrow edema with intermediate to low T1 marrow signal within each of the residual metatarsal shafts as well as the first and second metatarsal bases. Relative sparing of the third through fifth metatarsal bases. Findings are compatible with acute osteomyelitis. No definite findings of acute osteomyelitis within the midfoot on significantly motion degraded images. No gross malalignment. Ligaments Suboptimally assessed by motion. Muscles and Tendons Post amputation changes of the musculotendinous structures. No intramuscular fluid collections are seen. Soft tissues Probable wound or ulceration distal to the second metatarsal. No fluid collections are evident.  IMPRESSION: 1. Motion degraded exam. 2. Prior transmetatarsal amputation of the right forefoot with findings of acute osteomyelitis involving the residual first through fifth metatarsals. 3. Probable wound or ulceration distal to the second metatarsal. No fluid collections evident. Electronically Signed   By: Davina Poke D.O.   On: 11/15/2021 16:44   DG CHEST PORT 1 VIEW  Result Date: 11/15/2021 CLINICAL DATA:  Encounter for central line placement. EXAM: PORTABLE CHEST 1 VIEW COMPARISON:  Chest x-ray 11/15/2021 FINDINGS: Endotracheal tube tip is 1.9 cm above the carina. Enteric tube tip is in the gastric antrum. Left-sided central venous catheter tip projects over the SVC. Right lower  lobe collapse persists. Left lung is clear. Cardiomediastinal silhouette is stable. IMPRESSION: 1. Left-sided central venous catheter tip projects over the SVC. 2. Stable right lower lobe collapse. Electronically Signed   By: Ronney Asters M.D.   On: 11/15/2021 23:22   Portable Chest x-ray  Result Date: 11/15/2021 CLINICAL DATA:  Intubation. EXAM: PORTABLE CHEST 1 VIEW COMPARISON:  Chest x-ray 11/15/2021 FINDINGS: Endotracheal tube tip is 2 cm above the carina. Enteric tube extends below the diaphragm. There is new collapse of the right lower lobe. Left lung is clear. Right pleural effusion is not excluded. No pneumothorax. Cardiomediastinal silhouette is within normal limits. No fractures are seen. IMPRESSION: 1. There is new collapse of the right lower lobe, indeterminate. Can not exclude right pleural effusion. Please correlate clinically. 2. Endotracheal tube tip 2 cm above carina. Electronically Signed   By: Ronney Asters M.D.   On: 11/15/2021 21:45   DG Chest Port 1V same Day  Result Date: 11/15/2021 CLINICAL DATA:  Shortness of breath. EXAM: PORTABLE CHEST 1 VIEW COMPARISON:  Chest x-ray from yesterday. FINDINGS: The heart size and mediastinal contours are within normal limits. Both lungs are clear. The visualized  skeletal structures are unremarkable. IMPRESSION: No active disease. Electronically Signed   By: Titus Dubin M.D.   On: 11/15/2021 09:03   DG Abd Portable 1V  Result Date: 11/14/2021 CLINICAL DATA:  Leukocytosis, vomiting. EXAM: PORTABLE ABDOMEN - 1 VIEW COMPARISON:  None Available. FINDINGS: The bowel gas pattern is normal. Surgical clips overlie the abdomen. Vascular/seminal vesicle calcifications. Levoconvex curvature of the lumbar spine with associated degenerative change. IMPRESSION: Nonobstructive bowel gas pattern. Electronically Signed   By: Dahlia Bailiff M.D.   On: 11/14/2021 18:11   EEG adult  Result Date: 11/15/2021 Lora Havens, MD     11/15/2021  8:26 AM Patient Name: Tajah Schreiner MRN: [id number] Epilepsy Attending: Lora Havens Referring Physician/Provider: Amie Portland, MD Date: 11/14/2021 Duration: 24.52 mins Patient history: 61 year old male with sudden onset confusion and altered mental status.  EEG to evaluate for seizure. Level of alertness:  lethargic AEDs during EEG study: None Technical aspects: This EEG study was done with scalp electrodes positioned according to the 10-20 International system of electrode placement. Electrical activity was acquired at a sampling rate of 500Hz  and reviewed with a high frequency filter of 70Hz  and a low frequency filter of 1Hz . EEG data were recorded continuously and digitally stored. Description: No clear posterior dominant rhythm was seen.  EEG showed continuous generalized polymorphic mixed frequencies with predominantly 5 to 8 Hz theta-alpha activity admixed with intermittent generalized 2 to 3 Hz delta slowing.  Hyperventilation and photic stimulation were not performed.   ABNORMALITY - Continuous slow, generalized IMPRESSION: This study is suggestive of moderate diffuse encephalopathy, nonspecific etiology. No seizures or epileptiform discharges were seen throughout the recording. Lora Havens   EEG adult  Result Date:  11/04/2021 Lora Havens, MD     10/31/2021  2:26 PM Patient Name: Eivan Gallina MRN: [id number] Epilepsy Attending: Lora Havens Referring Physician/Provider: Jonetta Osgood, MD Date: 10/26/2021 Duration: 21.29 mins Patient history: 61 year old male with altered mental status.  EEG to evaluate for seizure. Level of alertness: Awake AEDs during EEG study: None Technical aspects: This EEG study was done with scalp electrodes positioned according to the 10-20 International system of electrode placement. Electrical activity was acquired at a sampling rate of 500Hz  and reviewed with a high frequency filter of 70Hz  and a low frequency  filter of 1Hz . EEG data were recorded continuously and digitally stored. Description: No clear posterior dominant rhythm was seen.  EEG showed continuous generalized predominantly 6 to 9 Hz theta-alpha activity admixed with intermittent generalized 2 to 3 Hz delta slowing which at times appears sharply contoured.  Hyperventilation and photic stimulation were not performed.   ABNORMALITY - Continuous slow, generalized IMPRESSION: This study is suggestive of moderate diffuse encephalopathy, nonspecific etiology. No seizures or epileptiform discharges were seen throughout the recording. Lora Havens   ECHOCARDIOGRAM COMPLETE  Result Date: November 23, 2021    ECHOCARDIOGRAM REPORT   Patient Name:   COLYN MIRON [facility] Date of Exam: 11-23-2021 Medical Rec #:  [id number]          Height:       67.0 in Accession #:    [id number]         Weight:       162.0 lb Date of Birth:  [birth year]           BSA:          1.849 m Patient Age:    71 years           BP:           88/49 mmHg Patient Gender: M                  HR:           85 bpm. Exam Location:  Inpatient Procedure: 2D Echo, Color Doppler, Cardiac Doppler and Intracardiac            Opacification Agent Indications:    Cardiac Arrest i46.9  History:        Patient has no prior history of Echocardiogram examinations.                 Risk  Factors:Hypertension and Diabetes.  Sonographer:    Raquel Sarna Senior RDCS Referring Phys: [phone] [facility]  Sonographer Comments: Scanned supine on artificial respirator. IMPRESSIONS  1. Left ventricular wall motion analysis is challenging even with Definity contrast, due to poor echo windows. There appears to be inferolateral wall pseudodyskinesis (diastolic inward displacement with rapid systolic outward displacement of the wall, due to high infradiaphragmatic pressures), rather than true dyskinesis. Left ventricular ejection fraction, by estimation, is 45 to 50%. The left ventricle has mildly decreased function. Left ventricular endocardial border not optimally defined to evaluate regional wall motion. Left ventricular diastolic parameters are consistent with Grade II diastolic dysfunction (pseudonormalization). Elevated left atrial pressure.  2. Right ventricular systolic function is moderately reduced. The right ventricular size is normal. There is mildly elevated pulmonary artery systolic pressure. The estimated right ventricular systolic pressure is 63.0 mmHg.  3. The mitral valve is normal in structure. No evidence of mitral valve regurgitation. No evidence of mitral stenosis.  4. The aortic valve is tricuspid. Aortic valve regurgitation is not visualized. No aortic stenosis is present.  5. The inferior vena cava is dilated in size with <50% respiratory variability, suggesting right atrial pressure of 15 mmHg. Comparison(s): Hard to exclude true dyskinesis of the inferolateral wall. There are Doppler signs of severely reduced cardiac output, which may be primarily related to RV dysfunction. FINDINGS  Left Ventricle: Left ventricular wall motion analysis is challenging even with Definity contrast, due to poor echo windows. There appears to be inferolateral wall pseudodyskinesis (diastolic inward displacement with rapid systolic outward displacement of the wall, due to high infradiaphragmatic pressures),  rather than true dyskinesis. Left ventricular  ejection fraction, by estimation, is 45 to 50%. The left ventricle has mildly decreased function. Left ventricular endocardial border not optimally defined to evaluate regional wall motion. Definity contrast agent was given IV to delineate the left ventricular endocardial borders. The left ventricular internal cavity size was normal in size. There is no left ventricular hypertrophy. Left ventricular  diastolic parameters are consistent with Grade II diastolic dysfunction (pseudonormalization). Elevated left atrial pressure. Right Ventricle: The right ventricular size is normal. No increase in right ventricular wall thickness. Right ventricular systolic function is moderately reduced. There is mildly elevated pulmonary artery systolic pressure. The tricuspid regurgitant velocity is 2.27 m/s, and with an assumed right atrial pressure of 15 mmHg, the estimated right ventricular systolic pressure is 97.9 mmHg. Left Atrium: Left atrial size was normal in size. Right Atrium: Right atrial size was normal in size. Pericardium: There is no evidence of pericardial effusion. Mitral Valve: The mitral valve is normal in structure. No evidence of mitral valve regurgitation. No evidence of mitral valve stenosis. Tricuspid Valve: The tricuspid valve is normal in structure. Tricuspid valve regurgitation is not demonstrated. No evidence of tricuspid stenosis. Aortic Valve: The aortic valve is tricuspid. Aortic valve regurgitation is not visualized. No aortic stenosis is present. Pulmonic Valve: The pulmonic valve was normal in structure. Pulmonic valve regurgitation is not visualized. No evidence of pulmonic stenosis. Aorta: The aortic root is normal in size and structure. Venous: IVC assessment for right atrial pressure unable to be performed due to mechanical ventilation. The inferior vena cava is dilated in size with less than 50% respiratory variability, suggesting right atrial  pressure of 15 mmHg. IAS/Shunts: No atrial level shunt detected by color flow Doppler.  LEFT VENTRICLE PLAX 2D LVIDd:         3.55 cm   Diastology LVIDs:         2.70 cm   LV e' medial:    3.26 cm/s LV PW:         0.70 cm   LV E/e' medial:  19.9 LV IVS:        0.90 cm   LV e' lateral:   3.15 cm/s LVOT diam:     1.90 cm   LV E/e' lateral: 20.6 LV SV:         28 LV SV Index:   15 LVOT Area:     2.84 cm  RIGHT VENTRICLE RV S prime:     9.03 cm/s TAPSE (M-mode): 1.1 cm LEFT ATRIUM             Index        RIGHT ATRIUM          Index LA diam:        2.10 cm 1.14 cm/m   RA Area:     9.89 cm LA Vol (A2C):   24.7 ml 13.36 ml/m  RA Volume:   22.30 ml 12.06 ml/m LA Vol (A4C):   30.9 ml 16.71 ml/m LA Biplane Vol: 30.4 ml 16.44 ml/m  AORTIC VALVE LVOT Vmax:   75.70 cm/s LVOT Vmean:  45.620 cm/s LVOT VTI:    0.100 m  AORTA Ao Root diam: 3.00 cm MITRAL VALVE               TRICUSPID VALVE MV Area (PHT): 2.10 cm    TR Peak grad:   20.6 mmHg MV Decel Time: 362 msec    TR Vmax:        227.00 cm/s MV E velocity: 65.00  cm/s MV A velocity: 61.30 cm/s  SHUNTS MV E/A ratio:  1.06        Systemic VTI:  0.10 m                            Systemic Diam: 1.90 cm Dani Gobble Croitoru MD Electronically signed by Sanda Klein MD Signature Date/Time: 11-17-2021/9:31:01 AM    Final     Microbiology Recent Results (from the past 240 hour(s))  Culture, blood (Routine X 2) w Reflex to ID Panel     Status: None (Preliminary result)   Collection Time: 11/14/21  3:31 PM   Specimen: BLOOD  Result Value Ref Range Status   Specimen Description BLOOD RIGHT ANTECUBITAL  Final   Special Requests   Final    BOTTLES DRAWN AEROBIC AND ANAEROBIC Blood Culture adequate volume   Culture   Final    NO GROWTH 3 DAYS Performed at [facility], [address], [place], [place] [address]    Report Status PENDING  Incomplete  Culture, blood (Routine X 2) w Reflex to ID Panel     Status: None (Preliminary result)   Collection Time: 11/14/21  3:37  PM   Specimen: BLOOD  Result Value Ref Range Status   Specimen Description BLOOD RIGHT ANTECUBITAL  Final   Special Requests   Final    BOTTLES DRAWN AEROBIC AND ANAEROBIC Blood Culture adequate volume   Culture   Final    NO GROWTH 3 DAYS Performed at [facility], [place] [address], [place], [place] [address]    Report Status PENDING  Incomplete  Culture, blood (Routine X 2) w Reflex to ID Panel     Status: None (Preliminary result)   Collection Time: 11/15/21  9:41 AM   Specimen: BLOOD  Result Value Ref Range Status   Specimen Description BLOOD RIGHT ANTECUBITAL  Final   Special Requests   Final    BOTTLES DRAWN AEROBIC AND ANAEROBIC Blood Culture adequate volume   Culture   Final    NO GROWTH 2 DAYS Performed at [facility], [place] [address] [place], [place] [address]    Report Status PENDING  Incomplete  Culture, blood (Routine X 2) w Reflex to ID Panel     Status: None (Preliminary result)   Collection Time: 11/15/21  9:41 AM   Specimen: BLOOD LEFT ARM  Result Value Ref Range Status   Specimen Description BLOOD LEFT ARM  Final   Special Requests   Final    BOTTLES DRAWN AEROBIC AND ANAEROBIC Blood Culture results may not be optimal due to an inadequate volume of blood received in culture bottles   Culture   Final    NO GROWTH 2 DAYS Performed at [facility], [place] [address], [place], [place] [address]    Report Status PENDING  Incomplete  MRSA Next Gen by PCR, Nasal     Status: None   Collection Time: 11/15/21  1:09 PM   Specimen: Nasal Mucosa; Nasal Swab  Result Value Ref Range Status   MRSA by PCR Next Gen NOT DETECTED NOT DETECTED Final    Comment: (NOTE) The GeneXpert MRSA Assay (FDA approved for NASAL specimens only), is one component of a comprehensive MRSA colonization surveillance program. It is not intended to diagnose MRSA infection nor to guide or monitor treatment for MRSA infections. Test performance is not FDA approved in patients less  than 48 years old.  Performed at [facility], [place] [address], [place], [place] [address]   Culture, Respiratory w Gram Stain     Status: None (Preliminary result)   Collection Time: 11/15/21 11:11 PM   Specimen: Tracheal Aspirate; Respiratory  Result Value Ref Range Status   Specimen Description TRACHEAL ASPIRATE  Final   Special Requests NONE  Final   Gram Stain   Final    NO SQUAMOUS EPITHELIAL CELLS SEEN FEW WBC SEEN FEW GRAM POSITIVE COCCI    Culture   Final    CULTURE REINCUBATED FOR BETTER GROWTH Performed at [facility], [place] [address], [place], [place] [address]    Report Status PENDING  Incomplete    Lab Basic Metabolic Panel: Recent Labs  Lab 11/06/2021 0038 11/13/21 0224 11/14/21 0051 11/15/21 0238 11/15/21 2127 11/15/21 2219 11/15/21 2227 2021-12-11 0512 December 11, 2021 0531  NA 141 141 140 139 138 139 139 134* 136  K 4.4 4.0 3.5 3.8 5.2* 3.8 3.5 4.3 4.5  CL 102 104 97* 92*  --  95*  --   --  96*  CO2 24 20* 25 21*  --  22  --   --  18*  GLUCOSE 212* 173* 82 140*  --  248*  --   --  334*  BUN 44* 53* 29* 30*  --  59*  --   --  64*  CREATININE 8.40* 10.18* 6.68* 5.87*  --  7.42*  --   --  7.93*  CALCIUM 9.8 9.9 9.7 9.7  --  8.8*  --   --  8.7*  MG 2.1  --   --   --   --  2.1  --   --  1.9  PHOS  --  4.6  --   --   --   --   --   --  8.8*   Liver Function Tests: Recent Labs  Lab 10/26/2021 0038 11/13/21 0224 11/14/21 0051 11/15/21 0238 11/15/21 2219 12/11/21 0531  AST 16  --  29 20 29 23   ALT 13  --  16 16 19 18   ALKPHOS 33*  --  35* 34* 37* 36*  BILITOT 0.7  --  1.4* 1.9* 0.9 0.8  PROT 7.7  --  8.3* 7.5 6.9 6.8  ALBUMIN 3.3* 3.1* 3.5 3.4* 2.9* 2.7*   Recent Labs  Lab 11/14/21 0051  LIPASE 82*   Recent Labs  Lab 11/13/21 0614 11-Dec-2021 0531  AMMONIA 26 28   CBC: Recent Labs  Lab 10/30/2021 0038 11/13/21 0224 11/14/21 0051 11/15/21 0238 11/15/21 2127 11/15/21 2219 11/15/21 2227 2021/12/11 0512 12-11-21 0531  WBC 12.8* 12.0*  15.8* 30.2*  --  33.9*  --   --  37.3*  NEUTROABS 9.2*  --   --  26.4*  --   --   --   --   --   HGB 11.5* 11.1* 11.8* 12.5* 11.2* 11.0* 10.9* 10.9* 11.1*  HCT 33.4* 33.0* 36.0* 38.0* 33.0* 33.6* 32.0* 32.0* 32.1*  MCV 91.5 92.2 92.5 92.9  --  93.1  --   --  90.9  PLT 205 198 231 214  --  323  --   --  330   Cardiac Enzymes: No results for input(s): CKTOTAL, CKMB, CKMBINDEX, TROPONINI in the last 168 hours. Sepsis Labs: Recent Labs  Lab 11/14/21 0051 11/15/21 0238 11/15/21 2219 12-11-21 0226 2021-12-11 0531  WBC 15.8* 30.2* 33.9*  --  37.3*  LATICACIDVEN  --   --   --  2.4*  --     Procedures/Operations  6/2 intubation, central line placement, arterial line placement   Maryjane Hurter 11/17/2021, 11:49 AM

## 2021-12-14 DEATH — deceased
